# Patient Record
Sex: Male | Born: 1940 | Race: White | Hispanic: No | Marital: Single | Smoking: Current some day smoker
Health system: Southern US, Community
[De-identification: ages and names within clinical notes are randomized; demographics above are authoritative.]

## PROBLEM LIST (undated history)

## (undated) DIAGNOSIS — Z789 Other specified health status: Secondary | ICD-10-CM

## (undated) HISTORY — PX: COLONOSCOPY: SHX174

---

## 2000-08-10 ENCOUNTER — Encounter: Payer: Self-pay | Admitting: Emergency Medicine

## 2000-08-10 ENCOUNTER — Emergency Department (HOSPITAL_COMMUNITY): Admission: EM | Admit: 2000-08-10 | Discharge: 2000-08-10 | Payer: Self-pay | Admitting: Emergency Medicine

## 2015-06-11 ENCOUNTER — Emergency Department (HOSPITAL_COMMUNITY)
Admission: EM | Admit: 2015-06-11 | Discharge: 2015-06-11 | Disposition: A | Discharge status: Home / Self Care | Payer: Non-veteran care | Attending: Emergency Medicine | Admitting: Emergency Medicine

## 2015-06-11 ENCOUNTER — Emergency Department (HOSPITAL_COMMUNITY): Payer: Non-veteran care

## 2015-06-11 ENCOUNTER — Encounter (HOSPITAL_COMMUNITY): Payer: Self-pay | Admitting: *Deleted

## 2015-06-11 DIAGNOSIS — Y998 Other external cause status: Secondary | ICD-10-CM | POA: Insufficient documentation

## 2015-06-11 DIAGNOSIS — Y9389 Activity, other specified: Secondary | ICD-10-CM | POA: Diagnosis not present

## 2015-06-11 DIAGNOSIS — Z791 Long term (current) use of non-steroidal anti-inflammatories (NSAID): Secondary | ICD-10-CM | POA: Insufficient documentation

## 2015-06-11 DIAGNOSIS — W01198A Fall on same level from slipping, tripping and stumbling with subsequent striking against other object, initial encounter: Secondary | ICD-10-CM | POA: Insufficient documentation

## 2015-06-11 DIAGNOSIS — Z720 Tobacco use: Secondary | ICD-10-CM | POA: Insufficient documentation

## 2015-06-11 DIAGNOSIS — S0990XA Unspecified injury of head, initial encounter: Secondary | ICD-10-CM

## 2015-06-11 DIAGNOSIS — Y9289 Other specified places as the place of occurrence of the external cause: Secondary | ICD-10-CM | POA: Insufficient documentation

## 2015-06-11 DIAGNOSIS — S0081XA Abrasion of other part of head, initial encounter: Secondary | ICD-10-CM | POA: Diagnosis not present

## 2015-06-11 NOTE — ED Notes (Signed)
Pt reports he was moving grass, tripped, fell and hit head on concrete. Pt possibly had LOC, was disoriented after fall. Pt have difficulty walking. Family reports pt was confused initially, family reports pt is at baseline norm. No neuro deficits noted at this time. Denies pain. Pt has bruising and lacerations to right eye area.

## 2015-06-11 NOTE — ED Provider Notes (Signed)
CSN: [id number]     Arrival date & time 06/11/15  1731 History   First MD Initiated Contact with Patient 06/11/15 2016     Chief Complaint  Patient presents with  . Fall  . Head Injury      HPI Patient reports he was mowing the grass this afternoon when he tripped over some trash cans.  He fell forward and injured his right forehead and right peroneal region.  He denies changes in his vision.  His wife reports that initially he came into the house and seemed to lose his balance again and this concerned her.  She brought him to the emergency department for evaluation of closed head injury.  No use of anticoagulants.  He does admit to drinking whiskey today.  He states he feels much better this time.      History reviewed. No pertinent past medical history. History reviewed. No pertinent past surgical history. History reviewed. No pertinent family history. History  Substance Use Topics  . Smoking status: Current Some Day Smoker  . Smokeless tobacco: Not on file  . Alcohol Use: Yes    Review of Systems  All other systems reviewed and are negative.     Allergies  Review of patient's allergies indicates no known allergies.  Home Medications   Prior to Admission medications   Medication Sig Start Date End Date Taking? Authorizing Provider  naproxen (NAPROSYN) 500 MG tablet Take 500 mg by mouth daily at 12 noon.   Yes Historical Provider, MD   BP 118/75 mmHg  Pulse 77  Temp(Src) 98.2 F (36.8 C) (Oral)  Resp 20  SpO2 96% Physical Exam  Constitutional: He is oriented to person, place, and time. He appears well-developed and well-nourished.  HENT:  Head: Normocephalic and atraumatic.  Small abrasions to his right paravertebral rim.  I movements are intact.  No lacerations.  No active bleeding.  Eyes: EOM are normal.  Neck: Normal range of motion.  Cardiovascular: Normal rate, regular rhythm, normal heart sounds and intact distal pulses.   Pulmonary/Chest: Effort normal  and breath sounds normal. No respiratory distress.  Abdominal: Soft. He exhibits no distension. There is no tenderness.  Musculoskeletal: Normal range of motion.  Neurological: He is alert and oriented to person, place, and time.  Skin: Skin is warm and dry.  Psychiatric: He has a normal mood and affect. Judgment normal.  Nursing note and vitals reviewed.   ED Course  Procedures (including critical care time) Labs Review Labs Reviewed - No data to display  Imaging Review Ct Head Wo Contrast  06/11/2015   CLINICAL DATA:  Dizziness with fall  EXAM: CT HEAD WITHOUT CONTRAST  TECHNIQUE: Contiguous axial images were obtained from the base of the skull through the vertex without intravenous contrast.  COMPARISON:  None.  FINDINGS: There is age related volume loss. There is no intracranial mass, hemorrhage, extra-axial fluid collection, or midline shift. The gray-white compartments appear normal. No acute infarct apparent. The bony calvarium appears intact. The mastoid air cells are clear. There is soft tissue swelling over the right upper face.  IMPRESSION: Soft tissue swelling over right upper face. There is age related volume loss. No intracranial mass, hemorrhage, or extra-axial fluid collection. Gray-white compartments appear within normal limits.   Electronically Signed   By: Bretta Bang III M.D.   On: 06/11/2015 19:50  I personally reviewed the imaging tests through PACS system I reviewed available ER/hospitalization records through the EMR    EKG Interpretation  Date/Time:  Thursday June 11 2015 17:59:27 EDT Ventricular Rate:  76 PR Interval:  168 QRS Duration: 111 QT Interval:  393 QTC Calculation: 442 R Axis:   69 Text Interpretation:  Sinus rhythm Baseline wander in lead(s) V2 No old  tracing to compare Confirmed by Paetyn Pietrzak  MD, Caryn BeeKEVIN ([id number]) on 06/11/2015  9:05:03 PM      MDM   Final diagnoses:  None   Patient is overall well-appearing.  Discharge home in good  condition.  CT imaging negative.  C-spine nontender.    Azalia BilisKevin Pauline Pegues, MD 06/11/15 2122

## 2015-06-11 NOTE — Discharge Instructions (Signed)

## 2018-11-28 HISTORY — PX: FOOT SURGERY: SHX648

## 2020-01-05 ENCOUNTER — Emergency Department (HOSPITAL_COMMUNITY): Payer: No Typology Code available for payment source

## 2020-01-05 ENCOUNTER — Emergency Department (HOSPITAL_COMMUNITY)
Admission: EM | Admit: 2020-01-05 | Discharge: 2020-01-05 | Disposition: A | Discharge status: Home / Self Care | Payer: No Typology Code available for payment source | Attending: Emergency Medicine | Admitting: Emergency Medicine

## 2020-01-05 ENCOUNTER — Other Ambulatory Visit: Payer: Self-pay

## 2020-01-05 ENCOUNTER — Encounter (HOSPITAL_COMMUNITY): Payer: Self-pay | Admitting: Emergency Medicine

## 2020-01-05 DIAGNOSIS — F17290 Nicotine dependence, other tobacco product, uncomplicated: Secondary | ICD-10-CM | POA: Diagnosis not present

## 2020-01-05 DIAGNOSIS — S99911A Unspecified injury of right ankle, initial encounter: Secondary | ICD-10-CM | POA: Diagnosis present

## 2020-01-05 DIAGNOSIS — W000XXA Fall on same level due to ice and snow, initial encounter: Secondary | ICD-10-CM | POA: Diagnosis not present

## 2020-01-05 DIAGNOSIS — X500XXA Overexertion from strenuous movement or load, initial encounter: Secondary | ICD-10-CM | POA: Diagnosis not present

## 2020-01-05 DIAGNOSIS — Y999 Unspecified external cause status: Secondary | ICD-10-CM | POA: Diagnosis not present

## 2020-01-05 DIAGNOSIS — S82891A Other fracture of right lower leg, initial encounter for closed fracture: Secondary | ICD-10-CM | POA: Insufficient documentation

## 2020-01-05 DIAGNOSIS — Z79899 Other long term (current) drug therapy: Secondary | ICD-10-CM | POA: Insufficient documentation

## 2020-01-05 DIAGNOSIS — Y9389 Activity, other specified: Secondary | ICD-10-CM | POA: Diagnosis not present

## 2020-01-05 DIAGNOSIS — Y92007 Garden or yard of unspecified non-institutional (private) residence as the place of occurrence of the external cause: Secondary | ICD-10-CM | POA: Diagnosis not present

## 2020-01-05 MED ORDER — HYDROCODONE-ACETAMINOPHEN 5-325 MG PO TABS: 1.0000 | ORAL_TABLET | Freq: Two times a day (BID) | ORAL | 0 refills | Status: DC | PRN

## 2020-01-05 NOTE — ED Notes (Signed)
Assist pt with bedpan 

## 2020-01-05 NOTE — ED Provider Notes (Signed)
[facility]-EMERGENCY DEPT Provider Note   CSN: [id number] Arrival date & time: 01/05/20  6629     History Chief Complaint  Patient presents with   Ankle Pain   Fall    Calem Cocozza is a 79 y.o. male.  HPI   79 year old male presenting for evaluation after mechanical fall.  States that he was walking to his mailbox to get the newspaper when he slipped in the snow and twisted his ankle.  Had sudden onset of pain.  States that initially his ankle was angled outwards but it "popped back into place ".  He has 5/10 pain to the right ankle at this time.  He denies any head trauma or LOC.  Denies any neck or back pain or any other injuries at this time.  He is not on any blood thinning medication.  History reviewed. No pertinent past medical history.  There are no problems to display for this patient.  History reviewed. No pertinent surgical history.    No family history on file.  Social History   Tobacco Use   Smoking status: Current Some Day Smoker    Types: Cigars   Smokeless tobacco: Never Used  Substance Use Topics   Alcohol use: Yes   Drug use: No    Home Medications Prior to Admission medications   Medication Sig Start Date End Date Taking? Authorizing Provider  diphenhydrAMINE (BENADRYL) 50 MG tablet Take 50 mg by mouth at bedtime as needed for sleep.   Yes [provider]  etodolac (LODINE) 300 MG capsule Take 300 mg by mouth 2 (two) times daily after a meal.   Yes [provider]  HYDROcodone-acetaminophen (NORCO/VICODIN) 5-325 MG tablet Take 1 tablet by mouth every 12 (twelve) hours as needed. 01/05/20   Ariely Riddell S, PA-C    Allergies    Patient has no known allergies.  Review of Systems   Review of Systems  Constitutional: Negative for fever.  HENT: Negative for ear pain and sore throat.   Eyes: Negative for visual disturbance.  Respiratory: Negative for shortness of breath.   Cardiovascular: Negative  for chest pain.  Gastrointestinal: Negative for abdominal pain.  Genitourinary: Negative for flank pain.  Musculoskeletal: Negative for back pain and neck pain.       Right ankle pain  Skin: Negative for rash.  Neurological:       No head trauma or loc  All other systems reviewed and are negative.   Physical Exam Updated Vital Signs BP (!) 162/80 (BP Location: Left Arm)    Pulse 86    Resp 18    Ht 5\' 11"  (1.803 m)    Wt 95.3 kg    SpO2 99%    BMI 29.29 kg/m   Physical Exam Vitals and nursing note reviewed.  Constitutional:      Appearance: He is well-developed.  HENT:     Head: Normocephalic and atraumatic.  Eyes:     Conjunctiva/sclera: Conjunctivae normal.  Cardiovascular:     Rate and Rhythm: Normal rate and regular rhythm.     Heart sounds: Normal heart sounds.  Pulmonary:     Effort: Pulmonary effort is normal. No respiratory distress.     Breath sounds: Normal breath sounds. No wheezing, rhonchi or rales.  Abdominal:     General: Bowel sounds are normal. There is no distension.     Palpations: Abdomen is soft.     Tenderness: There is no abdominal tenderness. There is no guarding  or rebound.  Musculoskeletal:     Cervical back: Neck supple.     Comments: Right ankle TTP to the bilat malleoli with diffuse swelling and obvious deformity. No obvious TTP throughout the foot, tib/fib, or knee. Normal distal pulses and sensation. Brisk cap refill. No midline TTP to the cervical, thoracic or lumbar spine.   Skin:    General: Skin is warm and dry.  Neurological:     Mental Status: He is alert.     Comments: Mental Status:  Alert, thought content appropriate, able to give a coherent history. Speech fluent without evidence of aphasia. Able to follow 2 step commands without difficulty.  Cranial Nerves:  II: pupils equal, round, reactive to light III,IV, VI: ptosis not present, extra-ocular motions intact bilaterally  V,VII: smile symmetric, facial light touch sensation  equal VIII: hearing grossly normal to voice  X: uvula elevates symmetrically  XI: bilateral shoulder shrug symmetric and strong XII: midline tongue extension without fassiculations Motor:  Normal tone. 5/5 strength of BUE and BLE major muscle groups including strong and equal grip strength  Sensory: light touch normal in all extremities.     ED Results / Procedures / Treatments   Labs (all labs ordered are listed, but only abnormal results are displayed) Labs Reviewed - No data to display  EKG None  Radiology DG Ankle 2 Views Right  Result Date: 01/05/2020 CLINICAL DATA:  Status post reduction of right ankle dislocation. EXAM: RIGHT ANKLE - 2 VIEW COMPARISON:  Same day. FINDINGS: Status post casting and immobilization of the right ankle. Moderately displaced distal right fibular fracture is noted. There remains mild lateral dislocation of the talus relative to distal tibia. IMPRESSION: Status post casting and immobilization of the right ankle. Moderately displaced distal right fibular fracture is noted. Mild lateral dislocation of the talus relative to distal tibia is noted. Electronically Signed   By: Marijo Conception M.D.   On: 01/05/2020 13:27   DG Ankle Complete Right  Result Date: 01/05/2020 CLINICAL DATA:  Acute RIGHT ankle pain and swelling following fall. Initial encounter. EXAM: RIGHT ANKLE - COMPLETE 3+ VIEW COMPARISON:  None. FINDINGS: An oblique fracture of the distal fibula is noted with 5 mm LATERAL displacement. A probable nondisplaced transverse fracture of the MEDIAL malleolus is noted. 4 mm LATERAL subluxation at the tibiotalar joint is noted. Associated soft tissue swelling is noted. IMPRESSION: 1. Oblique fracture of the distal fibula with 5 mm LATERAL displacement and probable nondisplaced transverse fracture of the MEDIAL malleolus. 2. 4 mm LATERAL subluxation at the tibiotalar joint. Electronically Signed   By: Margarette Canada M.D.   On: 01/05/2020 10:08   DG Foot Complete  Right  Result Date: 01/05/2020 CLINICAL DATA:  Acute RIGHT ankle pain and swelling following fall. Initial encounter. EXAM: RIGHT FOOT COMPLETE - 3+ VIEW COMPARISON:  None. FINDINGS: An oblique fracture of the distal fibula and a transverse fracture of the medial malleolus is visualized on the edge of the film. No other acute fracture, subluxation or dislocation identified. Degenerative changes in the midfoot and 1st MTP joint noted. The Lisfranc joints appear intact. IMPRESSION: 1. Distal fibular and tibial fractures which will be further discussed on the ankle radiographs. 2. Degenerative changes in the midfoot and 1st MTP joint. Electronically Signed   By: Margarette Canada M.D.   On: 01/05/2020 10:06    Procedures Procedures (including critical care time)  SPLINT APPLICATION Date/Time: 2:99 PM Authorized by: Rodney Booze Consent: Verbal consent obtained. Risks  and benefits: risks, benefits and alternatives were discussed Consent given by: patient Splint applied by: Dr. Rodena Medin and myself Location details: RLE Splint type: posterior ankle splint with stirrup Supplies used: posterior ankle splint with stirrup Post-procedure: The splinted body part was neurovascularly unchanged following the procedure. Patient tolerance: Patient tolerated the procedure well with no immediate complications.   Medications Ordered in ED Medications - No data to display  ED Course  I have reviewed the triage vital signs and the nursing notes.  Pertinent labs & imaging results that were available during my care of the patient were reviewed by me and considered in my medical decision making (see chart for details).    MDM Rules/Calculators/A&P                      79 year old man presenting after mechanical fall with right ankle injury.  No head trauma or LOC.  No tenderness to the midline cervical thoracic or lumbar spine.  Neurologic exam is normal.  He does have swelling and obvious deformity to the  right ankle.  He is neurovascularly intact distally.  Patient declines pain medications after initial evaluation.  X-ray right ankle with oblique fracture of the distal fibula with 5 mm LATERAL displacement and probable nondisplaced transverse fracture of the MEDIAL malleolus. Also with 4 mm LATERAL subluxation at the tibiotalar joint.  X-ray right foot with sistal fibular and tibial fractures which will be further discussed on the ankle radiographs. Degenerative changes in the midfoot and 1st MTP joint.  10:42 PM CONSULT with Dr. Magnus Ivan who recommends slight reduction while applying splint. Recommended slightly inverting the ankle and applying some extra padding. He will need to f/u in the office and will likely need definitive surgical repair.   Posterior ankle splint with stirrup placed in the ED. Reduction was performed by Dr. Rodena Medin. Post reduction films reviewed. Pt given crutches. He feels he will be able to manage with crutches and prefers this rather than a walker.  Case discussed with Dr. Rodena Medin who personally evaluated the pt and is in agreement with plan.   Discussed plan for f/u with ortho. He would like to f/u with the [place]. Advised he can do so but if unable to get appt in the next week he needs to f/u with Dr. Magnus Ivan. Advised on return precautions. He voiced understanding and is in agreement with plan. All questions answered, pt stable for d/c.    Final Clinical Impression(s) / ED Diagnoses Final diagnoses:  Closed fracture of right ankle, initial encounter    Rx / DC Orders ED Discharge Orders         Ordered    HYDROcodone-acetaminophen (NORCO/VICODIN) 5-325 MG tablet  Every 12 hours PRN     01/05/20 1354           Karrie Meres, PA-C 01/05/20 1357    Wynetta Fines, MD 01/05/20 [phone]

## 2020-01-05 NOTE — ED Provider Notes (Signed)
Reduction of Fracture (Right Ankle) Date/Time: 1:17 PM Performed by: Wynetta Fines Authorized by: Wynetta Fines Consent: Verbal consent obtained. Risks and benefits: risks, benefits and alternatives were discussed Consent given by: patient Required items: required blood products, implants, devices, and special equipment available Time out: Immediately prior to procedure a "time out" was called to verify the correct patient, procedure, equipment, support staff and site/side marked as required.  Patient tolerance: Patient tolerated the procedure well with no immediate complications. Joint: Right Ankle  Reduction technique: Manipulation during splint application (ankle inversion and medial pressure to lateral malleolus)    Wynetta Fines, MD 01/05/20 1318

## 2020-01-05 NOTE — Discharge Instructions (Addendum)
You were given information to follow-up with Dr. Magnus Ivan and orthopedic doctor to see you about your ankle fracture.  If you would prefer to follow-up at the [facility] you can do so but if you are unable to get an appointment in the next week then you should follow-up with Dr. Magnus Ivan as you will need to have surgery on the ankle to definitively repair it.  Prescription given for Norco. Take medication as directed and do not operate machinery, drive a car, or work while taking this medication as it can make you drowsy.   Do not put any weight on the right lower extremity.  Please return to the emergency department for any new or worsening symptoms.

## 2020-01-05 NOTE — ED Provider Notes (Signed)
SPLINT APPLICATION Date/Time: 1:13 PM Authorized by: Wynetta Fines Consent: Verbal consent obtained. Risks and benefits: risks, benefits and alternatives were discussed Consent given by: patient Splint applied by: Myself Location details: Right Ankle  Splint type: Right ankle posterior with stirrup Supplies used: orthoglass, ace wrap Post-procedure: The splinted body part was neurovascularly unchanged following the procedure. Patient tolerance: Patient tolerated the procedure well with no immediate complications.      Wynetta Fines, MD 01/05/20 1314

## 2020-01-05 NOTE — ED Notes (Signed)
ED Provider at bedside. 

## 2020-01-05 NOTE — ED Triage Notes (Signed)
Per [facility] pt was getting his paper form box when slipped on snow/ice and went down a little embankment. Pt has deformity to right ankle. Pt not on any blood thinners, no LOC.  Vitals: 170/100, 159/110, 85HR, 98% on RA.

## 2020-01-08 ENCOUNTER — Encounter: Payer: Self-pay | Admitting: Physician Assistant

## 2020-01-08 ENCOUNTER — Other Ambulatory Visit: Payer: Self-pay

## 2020-01-08 ENCOUNTER — Ambulatory Visit (INDEPENDENT_AMBULATORY_CARE_PROVIDER_SITE_OTHER): Payer: No Typology Code available for payment source | Admitting: Physician Assistant

## 2020-01-08 DIAGNOSIS — S82841A Displaced bimalleolar fracture of right lower leg, initial encounter for closed fracture: Secondary | ICD-10-CM

## 2020-01-08 NOTE — Progress Notes (Signed)
   Office Visit Note   Patient: Alan Edwards           Date of Birth: 1941-05-21           MRN: [id number] Visit Date: 01/08/2020              Requested by: [facility] [address] [facility] [place],  [place] [address] PCP: [facility]   Assessment & Plan: Visit Diagnoses:  1. Ankle fracture, bimalleolar, closed, right, initial encounter     Plan: Recommend open reduction internal fixation of the right ankle.  Risk benefits surgery discussed with the patient.  He will remain nonweightbearing on the right ankle and keep splint clean dry intact.  We will proceed with surgery early next week.  Questions were encouraged and answered at length.  Follow-Up Instructions: Return 2 weeks postop.   Orders:  No orders of the defined types were placed in this encounter.  No orders of the defined types were placed in this encounter.     Procedures: No procedures performed   Clinical Data: No additional findings.   Subjective: Chief Complaint  Patient presents with  . Right Ankle - Fracture    HPI Mr. Raczkowski is a 79 year old male who was seen for the first time.  He reports that on 01/05/2020 he was going out to get the paper and slipped in the snow injuring his right ankle.  He was seen in [place], ER where he was found to have right ankle fracture.  He is placed in a splint given crutches told to follow-up with orthopedics.  No other injury no loss of consciousness at the time of the right ankle injury.  He denies any fevers chills shortness of breath chest pain.  Denies any cardiac conditions, diabetes or other medical conditions. Radiographs right ankle 3 views are reviewed and shows an oblique fracture of the distal fibula with lateralization.  Nondisplaced transverse fracture of the medial malleolus.  Also lateral subluxation at the tibiotalar joint.  No other fractures. Review of Systems See HPI otherwise negative  Objective: Vital Signs:  There were no vitals taken for this visit.  Physical Exam Constitutional:      Appearance: He is normal weight. He is not ill-appearing or diaphoretic.  Pulmonary:     Effort: Pulmonary effort is normal.  Neurological:     Mental Status: He is alert and oriented to person, place, and time.  Psychiatric:        Mood and Affect: Mood normal.     Ortho Exam Right ankle splint is clean and dry and tight.  Sensation grossly intact throughout the toes.  Toes are well-perfused.  Right calf supple nontender. Specialty Comments:  No specialty comments available.  Imaging: No results found.   PMFS History: There are no problems to display for this patient.  History reviewed. No pertinent past medical history.  History reviewed. No pertinent family history.  History reviewed. No pertinent surgical history. Social History   Occupational History  . Not on file  Tobacco Use  . Smoking status: Current Some Day Smoker    Types: Cigars  . Smokeless tobacco: Never Used  Substance and Sexual Activity  . Alcohol use: Yes  . Drug use: No  . Sexual activity: Not on file

## 2020-01-09 ENCOUNTER — Other Ambulatory Visit: Payer: Self-pay | Admitting: Physician Assistant

## 2020-01-10 ENCOUNTER — Encounter (HOSPITAL_COMMUNITY): Payer: Self-pay | Admitting: Orthopaedic Surgery

## 2020-01-10 ENCOUNTER — Other Ambulatory Visit (HOSPITAL_COMMUNITY)
Admission: RE | Admit: 2020-01-10 | Discharge: 2020-01-10 | Disposition: A | Discharge status: Home / Self Care | Payer: No Typology Code available for payment source | Source: Ambulatory Visit | Attending: Orthopaedic Surgery | Admitting: Orthopaedic Surgery

## 2020-01-10 ENCOUNTER — Other Ambulatory Visit: Payer: Self-pay

## 2020-01-10 DIAGNOSIS — Z01812 Encounter for preprocedural laboratory examination: Secondary | ICD-10-CM | POA: Insufficient documentation

## 2020-01-10 DIAGNOSIS — Z20822 Contact with and (suspected) exposure to covid-19: Secondary | ICD-10-CM | POA: Diagnosis not present

## 2020-01-10 LAB — SARS CORONAVIRUS 2 (TAT 6-24 HRS): SARS Coronavirus 2: NEGATIVE

## 2020-01-10 NOTE — Progress Notes (Signed)
Spoke with pt for pre-op call. Pt denies cardiac history, diabetes or HTN. Pt asked that I talk with his friend [name] and give her the pre-op instructions which I did.   Pt will have his Covid test done today. Pt and [name] given quarantine instructions and voiced understanding. Pt had his 1st Covid vaccine 2 weeks ago. 2nd is scheduled for next week.   Pt goes to the [facility] for healthcare.  Pt instructed not to smoke 24 hours prior to surgery.  [name] states she will come by this afternoon after pt gets his Covid test done and they will pick up the Pre-Surgery Ensure drink - instructions given to [facility].

## 2020-01-14 ENCOUNTER — Encounter (HOSPITAL_COMMUNITY): Payer: Self-pay | Admitting: Orthopaedic Surgery

## 2020-01-14 ENCOUNTER — Ambulatory Visit (HOSPITAL_COMMUNITY)
Admission: RE | Admit: 2020-01-14 | Discharge: 2020-01-14 | Disposition: A | Discharge status: Home / Self Care | Payer: No Typology Code available for payment source | Attending: Orthopaedic Surgery | Admitting: Orthopaedic Surgery

## 2020-01-14 ENCOUNTER — Encounter (HOSPITAL_COMMUNITY)
Admission: RE | Disposition: A | Discharge status: Home / Self Care | Payer: Self-pay | Source: Home / Self Care | Attending: Orthopaedic Surgery

## 2020-01-14 ENCOUNTER — Other Ambulatory Visit: Payer: Self-pay

## 2020-01-14 ENCOUNTER — Ambulatory Visit (HOSPITAL_COMMUNITY): Payer: No Typology Code available for payment source | Admitting: Anesthesiology

## 2020-01-14 ENCOUNTER — Ambulatory Visit (HOSPITAL_COMMUNITY): Payer: No Typology Code available for payment source

## 2020-01-14 DIAGNOSIS — W010XXA Fall on same level from slipping, tripping and stumbling without subsequent striking against object, initial encounter: Secondary | ICD-10-CM | POA: Diagnosis not present

## 2020-01-14 DIAGNOSIS — F17290 Nicotine dependence, other tobacco product, uncomplicated: Secondary | ICD-10-CM | POA: Insufficient documentation

## 2020-01-14 DIAGNOSIS — S93431A Sprain of tibiofibular ligament of right ankle, initial encounter: Secondary | ICD-10-CM | POA: Insufficient documentation

## 2020-01-14 DIAGNOSIS — S8261XA Displaced fracture of lateral malleolus of right fibula, initial encounter for closed fracture: Secondary | ICD-10-CM | POA: Insufficient documentation

## 2020-01-14 DIAGNOSIS — Z79899 Other long term (current) drug therapy: Secondary | ICD-10-CM | POA: Diagnosis not present

## 2020-01-14 DIAGNOSIS — Z419 Encounter for procedure for purposes other than remedying health state, unspecified: Secondary | ICD-10-CM

## 2020-01-14 HISTORY — DX: Other specified health status: Z78.9

## 2020-01-14 HISTORY — PX: ORIF ANKLE FRACTURE: SHX5408

## 2020-01-14 LAB — COMPREHENSIVE METABOLIC PANEL
ALT: 18 U/L (ref 0–44)
AST: 21 U/L (ref 15–41)
Albumin: 4.2 g/dL (ref 3.5–5.0)
Alkaline Phosphatase: 63 U/L (ref 38–126)
Anion gap: 11 (ref 5–15)
BUN: 12 mg/dL (ref 8–23)
CO2: 23 mmol/L (ref 22–32)
Calcium: 9.6 mg/dL (ref 8.9–10.3)
Chloride: 102 mmol/L (ref 98–111)
Creatinine, Ser: 1.23 mg/dL (ref 0.61–1.24)
GFR calc Af Amer: >60 mL/min (ref >60)
GFR calc non Af Amer: 56 mL/min — ABNORMAL LOW (ref >60)
Glucose, Bld: 204 mg/dL — ABNORMAL HIGH (ref 70–99)
Potassium: 4 mmol/L (ref 3.5–5.1)
Sodium: 136 mmol/L (ref 135–145)
Total Bilirubin: 1.1 mg/dL (ref 0.3–1.2)
Total Protein: 7.4 g/dL (ref 6.5–8.1)

## 2020-01-14 LAB — HEMOGLOBIN: Hemoglobin: 15.5 g/dL (ref 13.0–17.0)

## 2020-01-14 SURGERY — OPEN REDUCTION INTERNAL FIXATION (ORIF) ANKLE FRACTURE
Anesthesia: Monitor Anesthesia Care | Site: Ankle | Laterality: Right

## 2020-01-14 MED ORDER — PHENYLEPHRINE 40 MCG/ML (10ML) SYRINGE FOR IV PUSH (FOR BLOOD PRESSURE SUPPORT)
PREFILLED_SYRINGE | INTRAVENOUS | Status: DC | PRN
  Administered 2020-01-14 (×5): 80 ug via INTRAVENOUS

## 2020-01-14 MED ORDER — MIDAZOLAM HCL 2 MG/2ML IJ SOLN
INTRAMUSCULAR | Status: AC
  Administered 2020-01-14: 2 mg via INTRAVENOUS
  Filled 2020-01-14: qty 2

## 2020-01-14 MED ORDER — ONDANSETRON HCL 4 MG/2ML IJ SOLN
INTRAMUSCULAR | Status: AC
  Filled 2020-01-14: qty 2

## 2020-01-14 MED ORDER — DEXAMETHASONE SODIUM PHOSPHATE 4 MG/ML IJ SOLN
INTRAMUSCULAR | Status: DC | PRN
  Administered 2020-01-14: 4 mg via INTRAVENOUS

## 2020-01-14 MED ORDER — HYDROMORPHONE HCL 1 MG/ML IJ SOLN: 0.2500 mg | INTRAMUSCULAR | Status: DC | PRN

## 2020-01-14 MED ORDER — ONDANSETRON HCL 4 MG/2ML IJ SOLN: 4.0000 mg | Freq: Once | INTRAMUSCULAR | Status: DC | PRN

## 2020-01-14 MED ORDER — FENTANYL CITRATE (PF) 250 MCG/5ML IJ SOLN
INTRAMUSCULAR | Status: AC
  Filled 2020-01-14: qty 5

## 2020-01-14 MED ORDER — LACTATED RINGERS IV SOLN: INTRAVENOUS | Status: DC | PRN

## 2020-01-14 MED ORDER — PROPOFOL 10 MG/ML IV BOLUS
INTRAVENOUS | Status: AC
  Filled 2020-01-14: qty 20

## 2020-01-14 MED ORDER — PROPOFOL 10 MG/ML IV BOLUS
INTRAVENOUS | Status: DC | PRN
  Administered 2020-01-14: 20 mg via INTRAVENOUS
  Administered 2020-01-14: 30 mg via INTRAVENOUS

## 2020-01-14 MED ORDER — FENTANYL CITRATE (PF) 100 MCG/2ML IJ SOLN
INTRAMUSCULAR | Status: AC
  Administered 2020-01-14: 100 ug via INTRAVENOUS
  Filled 2020-01-14: qty 2

## 2020-01-14 MED ORDER — OXYCODONE HCL 5 MG/5ML PO SOLN: 5.0000 mg | Freq: Once | ORAL | Status: DC | PRN

## 2020-01-14 MED ORDER — CHLORHEXIDINE GLUCONATE 4 % EX LIQD: 60.0000 mL | Freq: Once | CUTANEOUS | Status: DC

## 2020-01-14 MED ORDER — ACETAMINOPHEN 500 MG PO TABS
1000.0000 mg | ORAL_TABLET | Freq: Once | ORAL | Status: AC
  Administered 2020-01-14: 1000 mg via ORAL
  Filled 2020-01-14: qty 2

## 2020-01-14 MED ORDER — MIDAZOLAM HCL 2 MG/2ML IJ SOLN: 2.0000 mg | Freq: Once | INTRAMUSCULAR | Status: AC

## 2020-01-14 MED ORDER — FENTANYL CITRATE (PF) 100 MCG/2ML IJ SOLN: 100.0000 ug | Freq: Once | INTRAMUSCULAR | Status: AC

## 2020-01-14 MED ORDER — PROPOFOL 500 MG/50ML IV EMUL
INTRAVENOUS | Status: DC | PRN
  Administered 2020-01-14: 100 ug/kg/min via INTRAVENOUS

## 2020-01-14 MED ORDER — PHENYLEPHRINE 40 MCG/ML (10ML) SYRINGE FOR IV PUSH (FOR BLOOD PRESSURE SUPPORT)
PREFILLED_SYRINGE | INTRAVENOUS | Status: AC
  Filled 2020-01-14: qty 10

## 2020-01-14 MED ORDER — ONDANSETRON HCL 4 MG/2ML IJ SOLN
INTRAMUSCULAR | Status: DC | PRN
  Administered 2020-01-14: 4 mg via INTRAVENOUS

## 2020-01-14 MED ORDER — LIDOCAINE 2% (20 MG/ML) 5 ML SYRINGE
INTRAMUSCULAR | Status: AC
  Filled 2020-01-14: qty 5

## 2020-01-14 MED ORDER — ROPIVACAINE HCL 5 MG/ML IJ SOLN
INTRAMUSCULAR | Status: DC | PRN
  Administered 2020-01-14: 40 mL via EPIDURAL

## 2020-01-14 MED ORDER — OXYCODONE HCL 5 MG PO TABS: 5.0000 mg | ORAL_TABLET | Freq: Four times a day (QID) | ORAL | 0 refills | Status: DC | PRN

## 2020-01-14 MED ORDER — 0.9 % SODIUM CHLORIDE (POUR BTL) OPTIME
TOPICAL | Status: DC | PRN
  Administered 2020-01-14: 15:00:00 1000 mL

## 2020-01-14 MED ORDER — OXYCODONE HCL 5 MG PO TABS: 5.0000 mg | ORAL_TABLET | Freq: Once | ORAL | Status: DC | PRN

## 2020-01-14 MED ORDER — DEXAMETHASONE SODIUM PHOSPHATE 10 MG/ML IJ SOLN
INTRAMUSCULAR | Status: DC | PRN
  Administered 2020-01-14: 10 mg

## 2020-01-14 MED ORDER — MIDAZOLAM HCL 2 MG/2ML IJ SOLN
INTRAMUSCULAR | Status: AC
  Filled 2020-01-14: qty 2

## 2020-01-14 MED ORDER — DEXAMETHASONE SODIUM PHOSPHATE 10 MG/ML IJ SOLN
INTRAMUSCULAR | Status: AC
  Filled 2020-01-14: qty 1

## 2020-01-14 MED ORDER — CEFAZOLIN SODIUM-DEXTROSE 2-4 GM/100ML-% IV SOLN
2.0000 g | INTRAVENOUS | Status: AC
  Administered 2020-01-14: 15:00:00 2 g via INTRAVENOUS
  Filled 2020-01-14: qty 100

## 2020-01-14 SURGICAL SUPPLY — 67 items
BANDAGE ESMARK 6X9 LF (GAUZE/BANDAGES/DRESSINGS) IMPLANT
BIT DRILL 122X3.5XAO NS (BIT) ×1 IMPLANT
BIT DRILL 2.6 (BIT) ×1
BIT DRILL 2.6MM (BIT) ×1
BIT DRILL 2.6X VARIAX 2 (BIT) ×1 IMPLANT
BIT DRILL 3.5 (BIT) ×2
BIT DRL 122X3.5XAO NS (BIT) ×1
BIT DRL 2.6X VARIAX 2 (BIT) ×1
BNDG ELASTIC 4X5.8 VLCR STR LF (GAUZE/BANDAGES/DRESSINGS) ×3 IMPLANT
BNDG ELASTIC 6X5.8 VLCR STR LF (GAUZE/BANDAGES/DRESSINGS) ×3 IMPLANT
BNDG ESMARK 4X9 LF (GAUZE/BANDAGES/DRESSINGS) ×3 IMPLANT
BNDG ESMARK 6X9 LF (GAUZE/BANDAGES/DRESSINGS)
COVER SURGICAL LIGHT HANDLE (MISCELLANEOUS) ×3 IMPLANT
COVER WAND RF STERILE (DRAPES) ×3 IMPLANT
CUFF TOURN SGL QUICK 34 (TOURNIQUET CUFF)
CUFF TOURN SGL QUICK 42 (TOURNIQUET CUFF) IMPLANT
CUFF TRNQT CYL 34X4.125X (TOURNIQUET CUFF) IMPLANT
DRAPE C-ARM 42X72 X-RAY (DRAPES) ×3 IMPLANT
DRAPE C-ARMOR (DRAPES) ×3 IMPLANT
DRAPE U-SHAPE 47X51 STRL (DRAPES) ×3 IMPLANT
DURAPREP 26ML APPLICATOR (WOUND CARE) ×3 IMPLANT
ELECT REM PT RETURN 9FT ADLT (ELECTROSURGICAL) ×3
ELECTRODE REM PT RTRN 9FT ADLT (ELECTROSURGICAL) ×1 IMPLANT
GAUZE SPONGE 4X4 12PLY STRL (GAUZE/BANDAGES/DRESSINGS) ×3 IMPLANT
GAUZE XEROFORM 5X9 LF (GAUZE/BANDAGES/DRESSINGS) ×3 IMPLANT
GLOVE BIO SURGEON STRL SZ8 (GLOVE) ×3 IMPLANT
GLOVE BIOGEL PI IND STRL 8 (GLOVE) ×2 IMPLANT
GLOVE BIOGEL PI INDICATOR 8 (GLOVE) ×4
GLOVE ORTHO TXT STRL SZ7.5 (GLOVE) ×3 IMPLANT
GOWN STRL REUS W/ TWL LRG LVL3 (GOWN DISPOSABLE) ×2 IMPLANT
GOWN STRL REUS W/ TWL XL LVL3 (GOWN DISPOSABLE) ×4 IMPLANT
GOWN STRL REUS W/TWL LRG LVL3 (GOWN DISPOSABLE) ×4
GOWN STRL REUS W/TWL XL LVL3 (GOWN DISPOSABLE) ×8
KIT BASIN OR (CUSTOM PROCEDURE TRAY) ×3 IMPLANT
KIT TURNOVER KIT B (KITS) ×3 IMPLANT
MANIFOLD NEPTUNE II (INSTRUMENTS) ×6 IMPLANT
NEEDLE HYPO 25GX1X1/2 BEV (NEEDLE) IMPLANT
NS IRRIG 1000ML POUR BTL (IV SOLUTION) ×3 IMPLANT
PACK ORTHO EXTREMITY (CUSTOM PROCEDURE TRAY) ×3 IMPLANT
PAD ABD 8X10 STRL (GAUZE/BANDAGES/DRESSINGS) ×6 IMPLANT
PAD ARMBOARD 7.5X6 YLW CONV (MISCELLANEOUS) ×6 IMPLANT
PAD CAST 4YDX4 CTTN HI CHSV (CAST SUPPLIES) ×1 IMPLANT
PADDING CAST COTTON 4X4 STRL (CAST SUPPLIES) ×2
PADDING CAST COTTON 6X4 STRL (CAST SUPPLIES) ×3 IMPLANT
PLATE FIBULA 4H (Plate) ×3 IMPLANT
SCREW BN T10 FT 14X3.5XSTRDR (Screw) ×2 IMPLANT
SCREW BONE 3.5X14MM (Screw) ×4 IMPLANT
SCREW BONE 3.5X16MM (Screw) ×6 IMPLANT
SCREW BONE 3.5X20MM (Screw) ×3 IMPLANT
SCREW BONE FT 3.5X44 (Screw) ×3 IMPLANT
SCREW BONE FT 3.5X55 (Screw) ×3 IMPLANT
SCREW LOCK HEX T10 3.5X10 (Screw) ×3 IMPLANT
SCREW LOCK HEX T10 3.5X12 (Screw) ×3 IMPLANT
SPONGE LAP 4X18 RFD (DISPOSABLE) ×6 IMPLANT
SUCTION FRAZIER HANDLE 10FR (MISCELLANEOUS) ×2
SUCTION TUBE FRAZIER 10FR DISP (MISCELLANEOUS) ×1 IMPLANT
SUT ETHILON 2 0 FS 18 (SUTURE) ×3 IMPLANT
SUT VIC AB 0 CT1 27 (SUTURE) ×2
SUT VIC AB 0 CT1 27XBRD ANBCTR (SUTURE) ×1 IMPLANT
SUT VIC AB 2-0 CT1 27 (SUTURE) ×2
SUT VIC AB 2-0 CT1 TAPERPNT 27 (SUTURE) ×1 IMPLANT
SYR CONTROL 10ML LL (SYRINGE) IMPLANT
TOWEL GREEN STERILE (TOWEL DISPOSABLE) ×3 IMPLANT
TOWEL GREEN STERILE FF (TOWEL DISPOSABLE) ×3 IMPLANT
TUBE CONNECTING 12'X1/4 (SUCTIONS) ×1
TUBE CONNECTING 12X1/4 (SUCTIONS) ×2 IMPLANT
WATER STERILE IRR 1000ML POUR (IV SOLUTION) ×3 IMPLANT

## 2020-01-14 NOTE — Anesthesia Procedure Notes (Signed)
Anesthesia Regional Block: Popliteal block   Pre-Anesthetic Checklist: ,, timeout performed, Correct Patient, Correct Site, Correct Laterality, Correct Procedure, Correct Position, site marked, Risks and benefits discussed,  Surgical consent,  Pre-op evaluation,  At surgeon's request and post-op pain management  Laterality: Right  Prep: chloraprep       Needles:  Injection technique: Single-shot  Needle Type: Echogenic Stimulator Needle          Additional Needles:   Narrative:  Start time: 01/14/2020 1:28 PM End time: 01/14/2020 1:38 PM Injection made incrementally with aspirations every 5 mL.  Performed by: Personally  Anesthesiologist: Heather Roberts, MD  Additional Notes: A functioning IV was confirmed and monitors were applied.  Sterile prep and drape, hand hygiene and sterile gloves were used.  Negative aspiration and test dose prior to incremental administration of local anesthetic. The patient tolerated the procedure well.Ultrasound  guidance: relevant anatomy identified, needle position confirmed, local anesthetic spread visualized around nerve(s), vascular puncture avoided.  Image printed for medical record. ACB supplementation.

## 2020-01-14 NOTE — Brief Op Note (Signed)
01/14/2020  3:35 PM  PATIENT:  Alan Edwards  79 y.o. male  PRE-OPERATIVE DIAGNOSIS:  right lateral malleolus ankle fracture  POST-OPERATIVE DIAGNOSIS:  1) right lateral malleolus ankle fracture             2) syndesmosis disruption right ankle  PROCEDURE:  Procedure(s): OPEN REDUCTION INTERNAL FIXATION (ORIF) RIGHT ANKLE FRACTURE (Right)  SURGEON:  Surgeon(s) and Role:    Kathryne Hitch, MD - Primary  PHYSICIAN ASSISTANT: Rexene Edison, PA-C  ANESTHESIA:   regional and IV sedation  EBL:  25 mL   COUNTS:  YES  TOURNIQUET:  * Missing tourniquet times found for documented tourniquets in log: [id number] *  DICTATION: .Other Dictation: Dictation Number [id number]  PLAN OF CARE: Discharge to home after PACU  PATIENT DISPOSITION:  PACU - hemodynamically stable.   Delay start of Pharmacological VTE agent (>24hrs) due to surgical blood loss or risk of bleeding: no

## 2020-01-14 NOTE — Anesthesia Postprocedure Evaluation (Signed)
Anesthesia Post Note  Patient: Alan Edwards  Procedure(s) Performed: OPEN REDUCTION INTERNAL FIXATION (ORIF) RIGHT ANKLE FRACTURE (Right Ankle)     Patient location during evaluation: PACU Anesthesia Type: Regional and MAC Level of consciousness: awake and alert Pain management: pain level controlled Vital Signs Assessment: post-procedure vital signs reviewed and stable Respiratory status: spontaneous breathing, nonlabored ventilation, respiratory function stable and patient connected to nasal cannula oxygen Cardiovascular status: stable and blood pressure returned to baseline Postop Assessment: no apparent nausea or vomiting Anesthetic complications: no    Last Vitals:  Vitals:   01/14/20 1632 01/14/20 1647  BP: 111/68 130/75  Pulse: 67 75  Resp: 17 20  Temp:  37.1 C  SpO2: 94% 95%    Last Pain:  Vitals:   01/14/20 1647  TempSrc:   PainSc: 0-No pain                 Myquan Schaumburg DANIEL

## 2020-01-14 NOTE — Anesthesia Preprocedure Evaluation (Addendum)
Anesthesia Evaluation  Patient identified by MRN, date of birth, ID band Patient awake    Reviewed: Allergy & Precautions, NPO status , Patient's Chart, lab work & pertinent test results  Airway Mallampati: II  TM Distance: >3 FB Neck ROM: Full    Dental  (+) Poor Dentition, Dental Advisory Given, Missing, Loose,    Pulmonary Current Smoker and Patient abstained from smoking.,  hasnt smoked in 4 weeks   Pulmonary exam normal breath sounds clear to auscultation       Cardiovascular negative cardio ROS Normal cardiovascular exam Rhythm:Regular Rate:Normal     Neuro/Psych negative neurological ROS  negative psych ROS   GI/Hepatic negative GI ROS, (+)     substance abuse  alcohol use,   Endo/Other  negative endocrine ROS  Renal/GU negative Renal ROS  negative genitourinary   Musculoskeletal Right ankle bimalleolar fx   Abdominal Normal abdominal exam  (+)   Peds negative pediatric ROS (+)  Hematology negative hematology ROS (+)   Anesthesia Other Findings   Reproductive/Obstetrics negative OB ROS                          Anesthesia Physical Anesthesia Plan  ASA: II  Anesthesia Plan: MAC and Regional   Post-op Pain Management:  Regional for Post-op pain   Induction:   PONV Risk Score and Plan: 2 and Propofol infusion and TIVA  Airway Management Planned: Natural Airway and Simple Face Mask  Additional Equipment: None  Intra-op Plan:   Post-operative Plan:   Informed Consent: I have reviewed the patients History and Physical, chart, labs and discussed the procedure including the risks, benefits and alternatives for the proposed anesthesia with the patient or authorized representative who has indicated his/her understanding and acceptance.       Plan Discussed with: CRNA  Anesthesia Plan Comments:         Anesthesia Quick Evaluation

## 2020-01-14 NOTE — Transfer of Care (Signed)
Immediate Anesthesia Transfer of Care Note  Patient: Alan Edwards  Procedure(s) Performed: OPEN REDUCTION INTERNAL FIXATION (ORIF) RIGHT ANKLE FRACTURE (Right Ankle)  Patient Location: PACU  Anesthesia Type:MAC and Regional  Level of Consciousness: awake, oriented and patient cooperative  Airway & Oxygen Therapy: Patient Spontanous Breathing and Patient connected to face mask oxygen  Post-op Assessment: Report given to RN and Post -op Vital signs reviewed and stable  Post vital signs: Reviewed  Last Vitals:  Vitals Value Taken Time  BP 102/75 01/14/20 1602  Temp    Pulse 73 01/14/20 1602  Resp 18 01/14/20 1602  SpO2 100 % 01/14/20 1602  Vitals shown include unvalidated device data.  Last Pain:  Vitals:   01/14/20 1345  TempSrc:   PainSc: 3       Patients Stated Pain Goal: 3 (27/74/12 8786)  Complications: No apparent anesthesia complications

## 2020-01-14 NOTE — Anesthesia Procedure Notes (Signed)
Procedure Name: MAC Date/Time: 01/14/2020 2:35 PM Performed by: Jenne Campus, CRNA Pre-anesthesia Checklist: Patient identified, Emergency Drugs available, Suction available and Patient being monitored Oxygen Delivery Method: Simple face mask

## 2020-01-14 NOTE — Discharge Instructions (Signed)
Do not put weight on your right foot or ankle. Ice and elevation several times daily for swelling. Keep your splint clean and dry.

## 2020-01-14 NOTE — H&P (Signed)
Coal Nearhood is an 79 y.o. male.   Chief Complaint:  Right ankle pain with known fracture HPI: The patient is a 79 year old gentleman who injured his right ankle after a mechanical fall.  He was seen in the emergency room and found to have an unstable right ankle lateral malleolus fracture.  There is widening of the ankle mortise.  We then saw him in follow-up in clinic.  We have recommended surgery due to this being an unstable ankle fracture.  Past Medical History:  Diagnosis Date  . Medical history non-contributory     Past Surgical History:  Procedure Laterality Date  . COLONOSCOPY      History reviewed. No pertinent family history. Social History:  reports that he has been smoking cigars. He has never used smokeless tobacco. He reports current alcohol use of about 21.0 standard drinks of alcohol per week. He reports that he does not use drugs.  Allergies: No Known Allergies  Medications Prior to Admission  Medication Sig Dispense Refill  . diphenhydrAMINE (BENADRYL) 50 MG tablet Take 50 mg by mouth at bedtime.     Marland Kitchen etodolac (LODINE) 300 MG capsule Take 300 mg by mouth 2 (two) times daily after a meal.    . HYDROcodone-acetaminophen (NORCO/VICODIN) 5-325 MG tablet Take 1 tablet by mouth every 12 (twelve) hours as needed. 6 tablet 0    No results found for this or any previous visit (from the past 48 hour(s)). No results found.  Review of Systems  All other systems reviewed and are negative.   Blood pressure 132/72, pulse 89, temperature 98.4 F (36.9 C), temperature source Oral, resp. rate 18, height 5\' 11"  (1.803 m), weight 90.7 kg, SpO2 99 %. Physical Exam  Constitutional: He is oriented to person, place, and time. He appears well-developed and well-nourished.  HENT:  Head: Normocephalic and atraumatic.  Eyes: Pupils are equal, round, and reactive to light. EOM are normal.  Cardiovascular: Normal rate.  Respiratory: Effort normal.  GI: Soft.  Musculoskeletal:      Cervical back: Normal range of motion and neck supple.     Right ankle: Swelling, deformity and ecchymosis present. Tenderness present over the lateral malleolus and medial malleolus. Decreased range of motion.  Neurological: He is alert and oriented to person, place, and time.  Skin: Skin is warm.  Psychiatric: He has a normal mood and affect.     Assessment/Plan Right ankle with unstable lateral malleolus fracture and widening of the ankle mortise  The patient understands that our plan is to proceed to surgery today for open reduction/internal fixation of his right ankle lateral malleolus to better align the fracture and ankle mortise.  We had a long and thorough discussion in the office about the risk and benefits of the surgery as well as the rationale for our recommendation for surgery.  , MD 01/14/2020, 11:27 AM

## 2020-01-14 NOTE — Progress Notes (Signed)
Care assumed post block. Patient alert and oriented x 4. Skin warm and dry. Resp. even and unlabored.

## 2020-01-14 NOTE — Op Note (Signed)
NAMEJABARI, Alan Edwards MEDICAL RECORD OJ:[id number] ACCOUNT [id number] DATE OF BIRTH:1941-04-18 FACILITY: [facility] LOCATION: [facility] PHYSICIAN:Caton Popowski Aretha Parrot, MD  OPERATIVE REPORT  DATE OF PROCEDURE:  01/14/2020  PREOPERATIVE  DIAGNOSIS:   Right ankle unstable lateral malleolus fracture.  POSTOPERATIVE DIAGNOSES:   1.  Right ankle unstable lateral malleolus fracture. 2.  Disruption of the ankle syndesmosis.  PROCEDURE: 1.  Open reduction internal fixation of right ankle lateral malleolus fracture. 2.  Open reduction and stabilization of the ankle syndesmosis.  IMPLANTS:   1.  A Stryker 4-hole VariAx fibular locking plate and screws. 2.  Two syndesmosis screws.  SURGEON:  Vanita Panda.  Magnus Ivan, MD  ASSISTANT:  Richardean Canal, PA-C  ANESTHESIA: 1.  Right lower extremity regional block. 2.  Mask ventilation, IV sedation.  TOURNIQUET TIME:  Under 1 hour.  ESTIMATED BLOOD LOSS:  Less than 50 mL.  COMPLICATIONS:  None.  ANTIBIOTICS:  Two g IV Ancef.  INDICATIONS:  The patient is a 79 year old gentleman who sustained a mechanical fall when he slipped in his driveway over a week ago.  He was seen in our office and found to have a lateral malleolus fracture with widening of the ankle mortise.  This was  an unstable fracture.  He had been seen for this in the emergency room as well and they were appropriately placed him in a splint.  We have advised him to stay off his ankle with elevation and nonweightbearing.  He presents for definitive fixation today.   A long discussion with him about the surgery, indications was had.  We talked about the risks and benefits of surgery in detail and showed him a model and his x-rays, describing why we need to fix his ankle.  DESCRIPTION OF PROCEDURE:  After informed consent was obtained and appropriate right ankle was marked, anesthesia obtained a regional block in the holding room.  He was then brought to the operating room and  placed supine on the operating table.  Mask  ventilation, IV sedation was obtained.  A nonsterile tourniquet was placed around his upper right thigh and his right knee, leg, ankle and foot were prepped and draped with DuraPrep and sterile drapes.  Of note, there was significant wear on his splint,  so obviously, he has been putting some weight on this.  There were also medial fracture blisters which we unroofed for placing Xeroform later.  Fortunately, we did not have to perform a surgery on the medial side.  His lateral side showed swelling, but  no blistering.  A time-out was called.  He was identified, correct right ankle.  We then used an Esmarch to wrap that leg and tourniquet was inflated to 300 mm of pressure.  I then made a lateral incision over the fibula and carried this proximally and  distally, dissected down to the fibular fracture itself and was able to identify the fracture.  We cleaned soft tissue debris and hematoma from this.  Using temporary reduction forceps, we were then able to reduce the fibula in a anatomic position and I  verified this under direct visualization and fluoroscopy.  I then placed a lag screw from anterior to posterior across the fracture, securing this.  We then chose our Stryker VariAx 4-hole locking plate and fashioned this along the lateral cortex of the  femur.  We secured this with bicortical screws proximally and locking screws distally.  I stressed the ankle syndesmosis and we felt that there was some still tilt of the talus  and some slight instability.  We put the ankle in a dorsiflexed position and  decided to pass 2 syndesmosis screws.  We passed them from lateral to medial and it was definitely posterolateral to anteromedial.  We put our 2 syndesmosis screws, capturing just 3 cortices with each screw.  We were then pleased with stability of the  ankle assessed clinically and radiographically.  We then irrigated the soft tissue with normal saline solution.  We  closed the deep tissue over the plate with 0 Vicryl, followed by 2-0 Vicryl to close the subcutaneous tissue and interrupted 2-0 nylon to  close the skin.  Xeroform was placed over the incision as well as the fracture blisters.  A well-padded plaster splint was placed with a posterior component and stirrup.  Tourniquet was let down.  His toes pinkened nicely.  He was taken to the recovery  room in stable condition with all final counts being correct.  There were no complications noted.  Postoperatively, he will be discharged later from the PACU and we will have him strict nonweightbearing on his right lower extremity until followup in the  office in 2 weeks.  Of note, Benita Stabile, PA-C did assist during the entire case.  Her assistance was crucial for facilitating all aspects of this case.  VN/NUANCE  D:01/14/2020 T:01/14/2020 JOB:[id number]

## 2020-01-15 ENCOUNTER — Telehealth: Payer: Self-pay | Admitting: Radiology

## 2020-01-15 NOTE — Telephone Encounter (Signed)
I spoke to him.

## 2020-01-15 NOTE — Telephone Encounter (Signed)
See below

## 2020-01-15 NOTE — Telephone Encounter (Signed)
Patient called triage line and states that he has no feeling in his foot and is unable to move his toes. He had surgery yesterday.  He would like a call back to advise.  CB for pt [phone]

## 2020-01-16 ENCOUNTER — Encounter: Payer: Self-pay | Admitting: *Deleted

## 2020-01-16 NOTE — Telephone Encounter (Signed)
noted 

## 2020-01-28 ENCOUNTER — Encounter: Payer: Self-pay | Admitting: Orthopaedic Surgery

## 2020-01-28 ENCOUNTER — Ambulatory Visit (INDEPENDENT_AMBULATORY_CARE_PROVIDER_SITE_OTHER): Payer: No Typology Code available for payment source

## 2020-01-28 ENCOUNTER — Other Ambulatory Visit: Payer: Self-pay

## 2020-01-28 ENCOUNTER — Ambulatory Visit (INDEPENDENT_AMBULATORY_CARE_PROVIDER_SITE_OTHER): Payer: Non-veteran care | Admitting: Orthopaedic Surgery

## 2020-01-28 DIAGNOSIS — Z9889 Other specified postprocedural states: Secondary | ICD-10-CM

## 2020-01-28 DIAGNOSIS — Z8781 Personal history of (healed) traumatic fracture: Secondary | ICD-10-CM | POA: Diagnosis not present

## 2020-01-28 DIAGNOSIS — S8261XD Displaced fracture of lateral malleolus of right fibula, subsequent encounter for closed fracture with routine healing: Secondary | ICD-10-CM

## 2020-01-28 NOTE — Progress Notes (Signed)
Patient is a very pleasant 79 year old gentleman who is now 2 weeks status post open reduction-internal fixation of a right ankle lateral malleolus fracture.  We also stabilize the syndesmosis with 2 syndesmosis screws.  He has been nonweightbearing using a knee scooter.  He reports minimal pain and said he is doing well overall.  On exam, we removed the sutures and placed Steri-Strips.  The incision itself looks good.  His foot is well-perfused with appropriate bruising but no severe swelling.  He is able to gently move his toes and ankle.  3 views of the right ankle are obtained and reviewed.  The ankle mortise is congruent and intact.  The hardware looks good with no evidence of failure.  The syndesmosis screws are intact.  There is appropriate overlap on the AP view of the fibula and tibia.  There is previous posttraumatic changes around the medial malleolus that is old.  At this point we will let him attempt weightbearing as tolerated in a cam walking boot.  We will see him back in 4 weeks with a repeat 3 views of his right ankle.  He will go slow with his mobility.  He understands that the syndesmosis screws will likely break with time that we would not remove these given his age and level of activity and he understands that as well.  All question concerns were answered and addressed.  We will see him back in 4 weeks.

## 2020-03-02 ENCOUNTER — Encounter: Payer: Self-pay | Admitting: Orthopaedic Surgery

## 2020-03-02 ENCOUNTER — Other Ambulatory Visit: Payer: Self-pay

## 2020-03-02 ENCOUNTER — Ambulatory Visit (INDEPENDENT_AMBULATORY_CARE_PROVIDER_SITE_OTHER): Payer: No Typology Code available for payment source | Admitting: Orthopaedic Surgery

## 2020-03-02 ENCOUNTER — Ambulatory Visit (INDEPENDENT_AMBULATORY_CARE_PROVIDER_SITE_OTHER): Payer: No Typology Code available for payment source

## 2020-03-02 DIAGNOSIS — Z9889 Other specified postprocedural states: Secondary | ICD-10-CM

## 2020-03-02 DIAGNOSIS — S8261XD Displaced fracture of lateral malleolus of right fibula, subsequent encounter for closed fracture with routine healing: Secondary | ICD-10-CM

## 2020-03-02 DIAGNOSIS — Z8781 Personal history of (healed) traumatic fracture: Secondary | ICD-10-CM | POA: Diagnosis not present

## 2020-03-02 NOTE — Progress Notes (Signed)
The patient is now almost 7 weeks status post open reduction and fixation of a complex right ankle fracture.  He has been weightbearing as tolerated in a cam walking boot and using his cane.  He states that his range of motion and strength are improving.  He has been full weightbearing.  Examination of his right ankle shows the lateral incision is healed nicely.  There is still residual ankle and foot swelling.  There is no evidence of infection.  He has a limited flexion and extension arc of the ankle.  It is ligamentously stable.  There is no medial tenderness.  3 views of the right ankle are obtained and show an intact and congruent ankle mortise.  The hardware itself is intact.  There is been significant interval healing of the fracture.  At this point we will transition him to an ASO with continued weightbearing as tolerated.  I have offered him physical therapy for his ankle.  He states that his age of 13 that he is fine without physical therapy and he feels like he can improve without it as well.  We will allow him to continue weightbearing as tolerated and I will see him back in 4 weeks with a repeat 3 views of his right ankle.  All questions and concerns were answered and addressed.

## 2020-03-30 ENCOUNTER — Other Ambulatory Visit: Payer: Self-pay

## 2020-03-30 ENCOUNTER — Ambulatory Visit (INDEPENDENT_AMBULATORY_CARE_PROVIDER_SITE_OTHER): Payer: No Typology Code available for payment source | Admitting: Orthopaedic Surgery

## 2020-03-30 ENCOUNTER — Ambulatory Visit (INDEPENDENT_AMBULATORY_CARE_PROVIDER_SITE_OTHER): Payer: No Typology Code available for payment source

## 2020-03-30 ENCOUNTER — Encounter: Payer: Self-pay | Admitting: Orthopaedic Surgery

## 2020-03-30 DIAGNOSIS — Z9889 Other specified postprocedural states: Secondary | ICD-10-CM

## 2020-03-30 DIAGNOSIS — Z8781 Personal history of (healed) traumatic fracture: Secondary | ICD-10-CM

## 2020-03-30 DIAGNOSIS — S8261XD Displaced fracture of lateral malleolus of right fibula, subsequent encounter for closed fracture with routine healing: Secondary | ICD-10-CM

## 2020-03-30 NOTE — Progress Notes (Signed)
The patient is a very active 79 year old gentleman who is 10 weeks status post open reduction/internal fixation of a complex right ankle fracture.  We fixed the lateral malleolus and had to place syndesmosis screws.  He does ambulate using a cane.  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  He says his foot and ankle are doing better and is getting better in the regular shoes and working outside.  On exam his incision is healed nicely over his right ankle.  He does have some limitations of flexion and extension given the nature of his fracture and syndesmosis screws but overall he looks good.  He is neurovascularly intact.  3 views of the right ankle are obtained and show the fracture is healing nicely.  There is no evidence of hardware failure.  The syndesmosis screws are intact and the ankle mortise is well aligned.  At this point since he is doing so well follow-up can be as needed.  He understands the screws may break and do not be concerned about that.  He understands that if there is any issues at all he can always come back and see [place].  I did show him to have strengthening exercises to try.  We offered him physical therapy but he says he does not need it at his age and he will still keep mowing lawns.

## 2021-01-07 IMAGING — CR DG ANKLE COMPLETE 3+V*R*
3 series · 3 of 3 positions shown · non-contrast
Comparison: None.

CLINICAL DATA: Acute RIGHT ankle pain and swelling following fall.
Initial encounter.

EXAM:
RIGHT ANKLE - COMPLETE 3+ VIEW

[x ankle ap right]
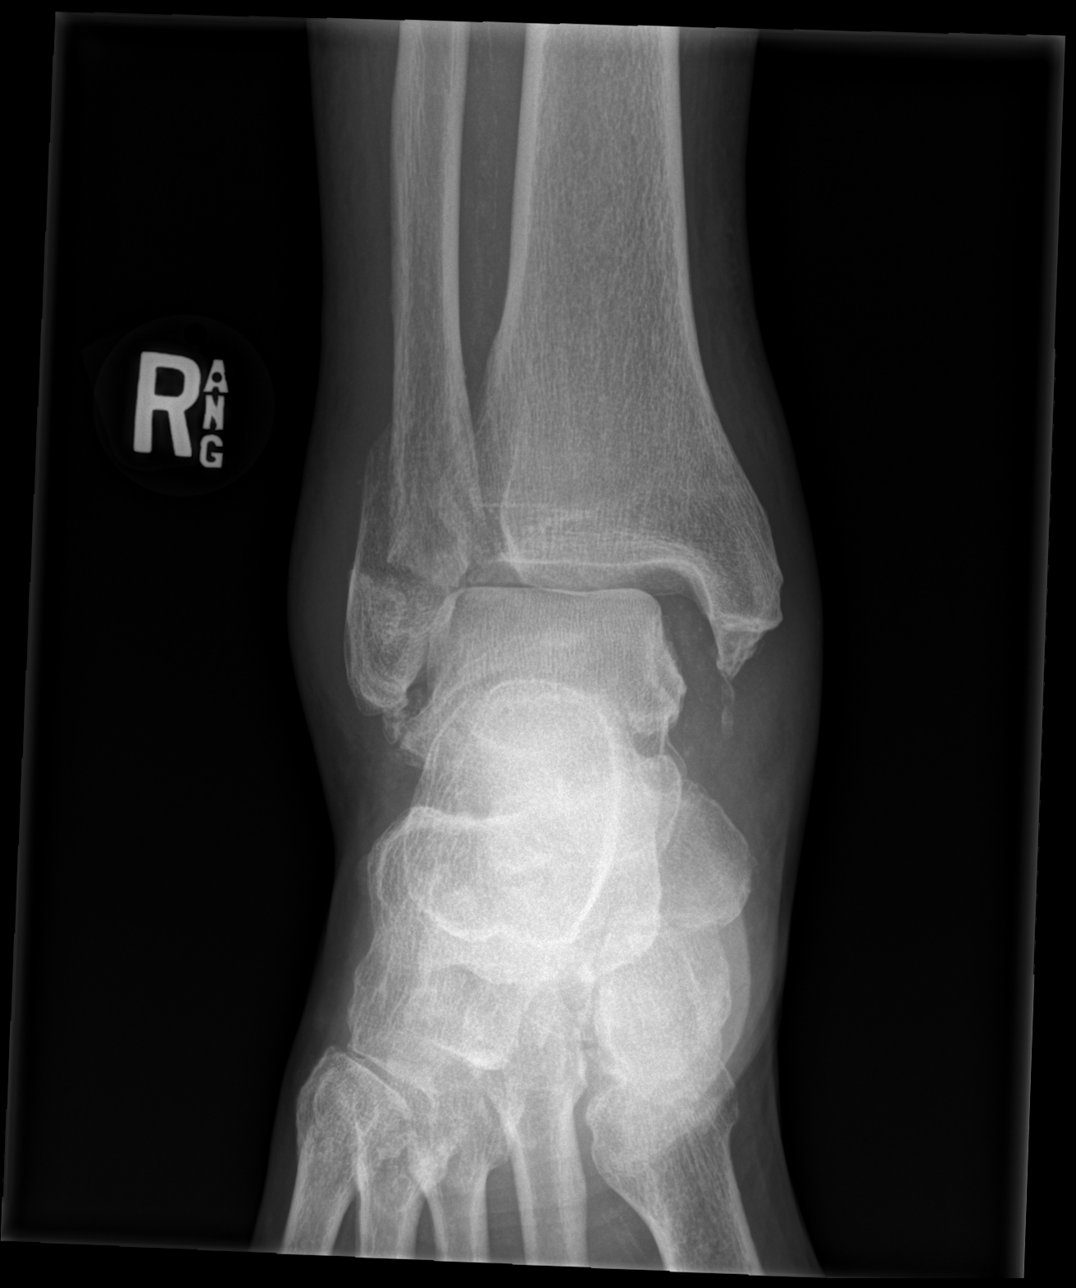

[x ankle obl right]
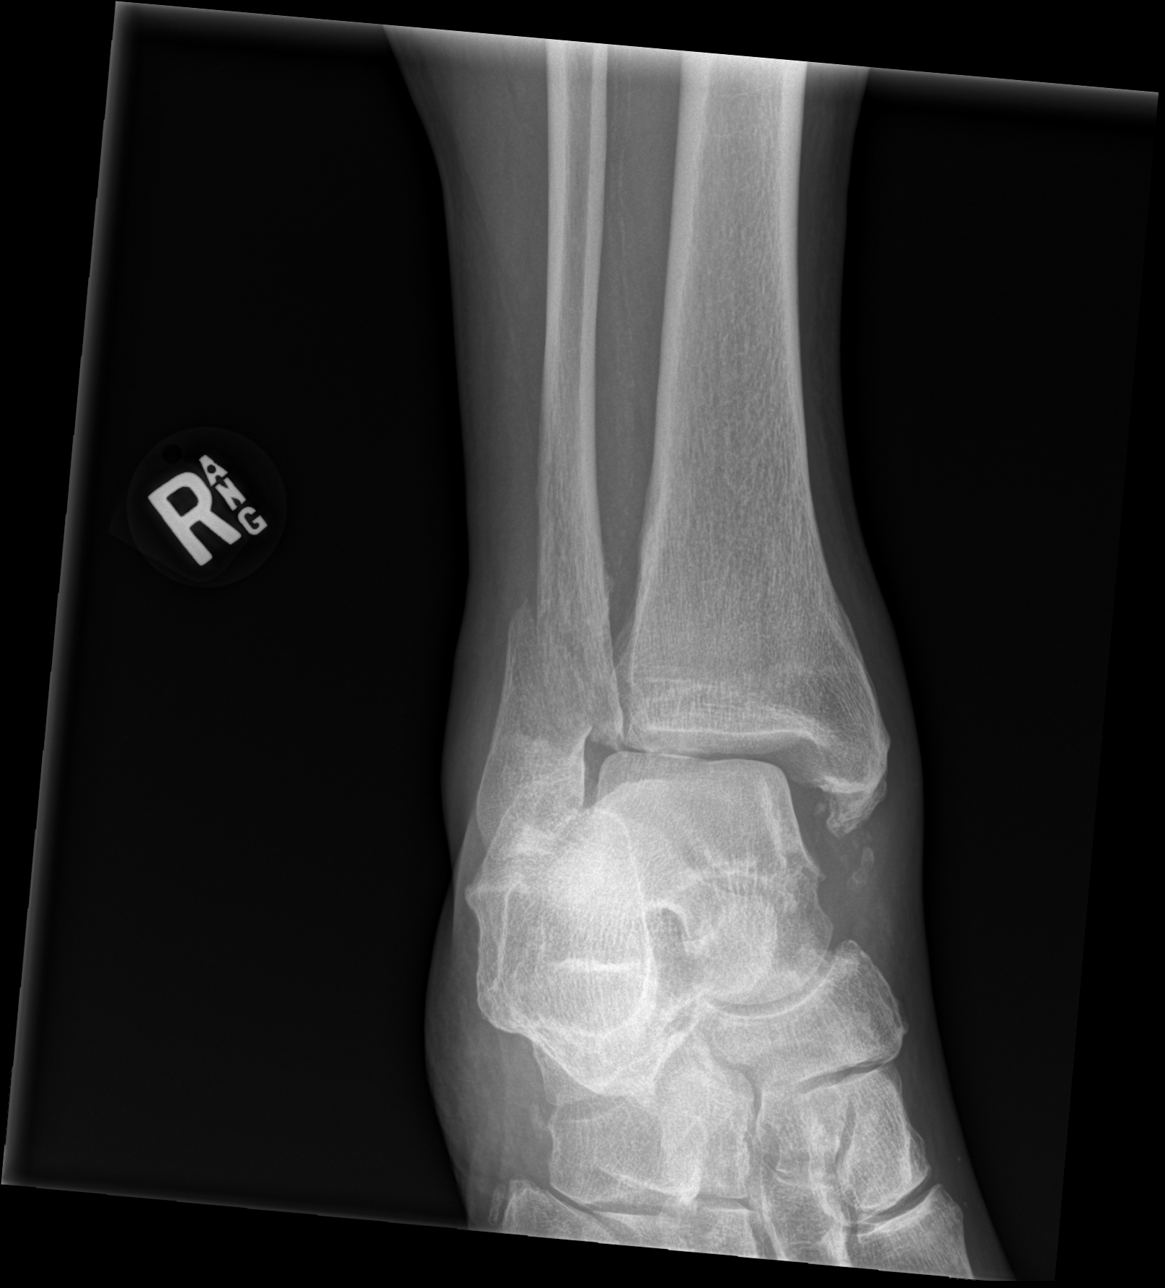

[x ankle lat right]
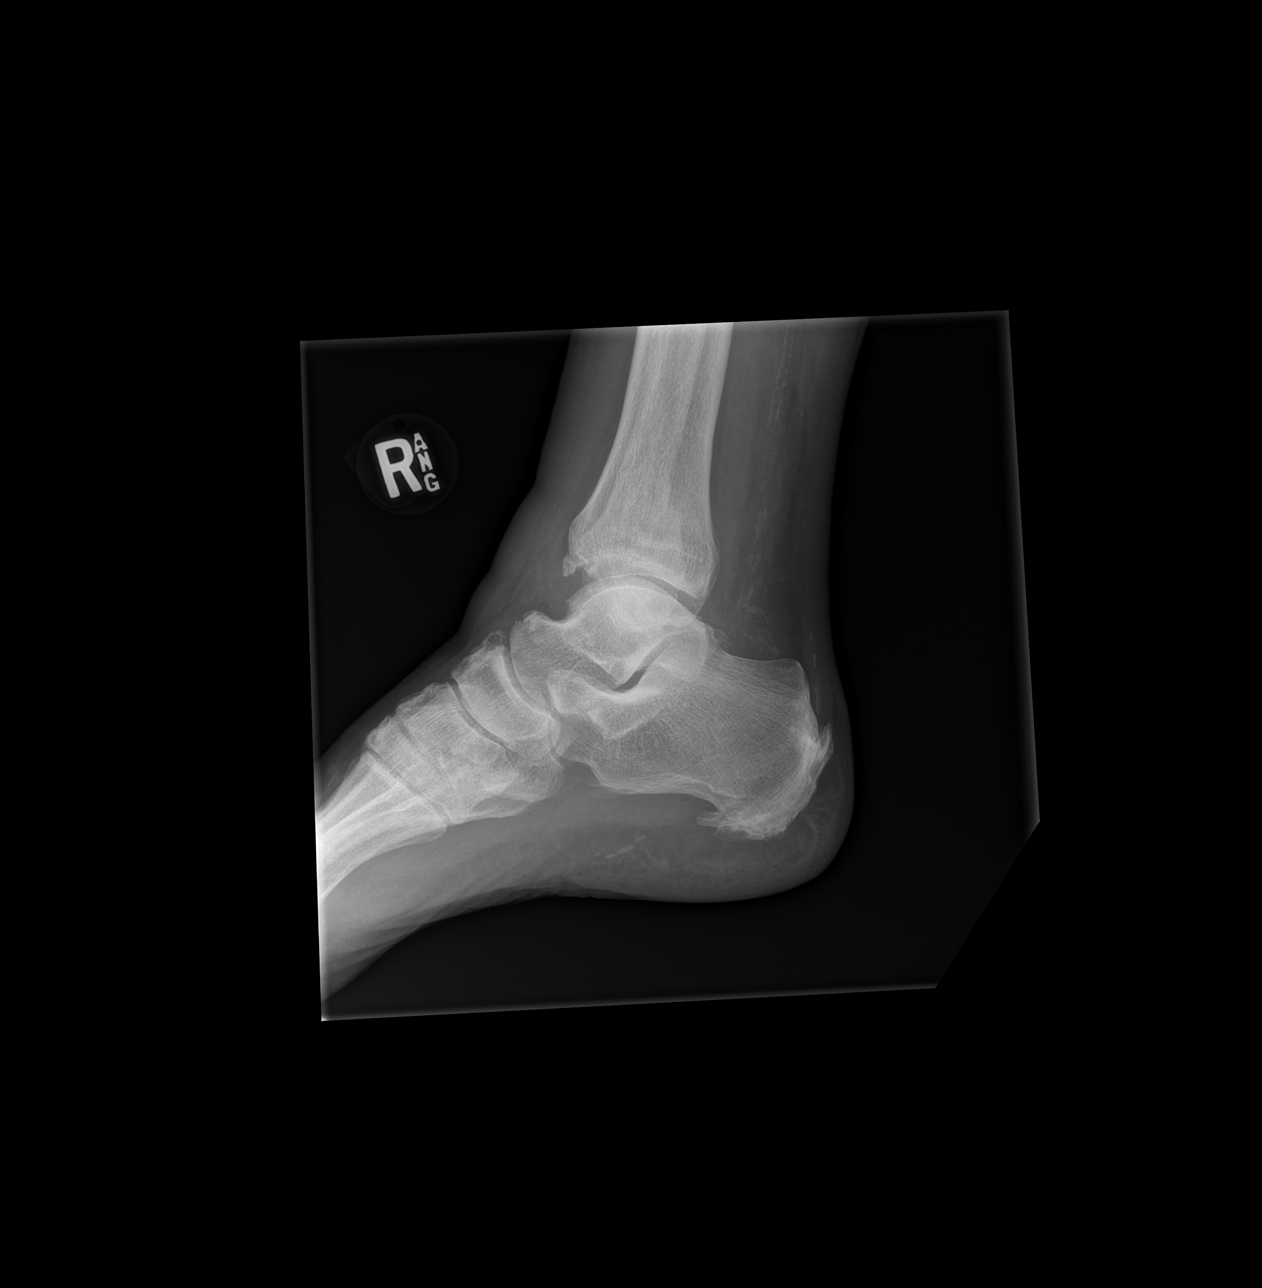

[3 of 3 positions shown; findings below may reference images not displayed]

FINDINGS: An oblique fracture of the distal fibula is noted with 5 mm LATERAL
displacement.

A probable nondisplaced transverse fracture of the MEDIAL malleolus
is noted.

4 mm LATERAL subluxation at the tibiotalar joint is noted.

Associated soft tissue swelling is noted.
IMPRESSION: 1. Oblique fracture of the distal fibula with 5 mm LATERAL
displacement and probable nondisplaced transverse fracture of the
MEDIAL malleolus.
2. 4 mm LATERAL subluxation at the tibiotalar joint.

## 2021-01-07 IMAGING — CR DG FOOT COMPLETE 3+V*R*
3 series · 3 of 3 positions shown · non-contrast
Comparison: None.

CLINICAL DATA: Acute RIGHT ankle pain and swelling following fall.
Initial encounter.

EXAM:
RIGHT FOOT COMPLETE - 3+ VIEW

[x foot lat right]
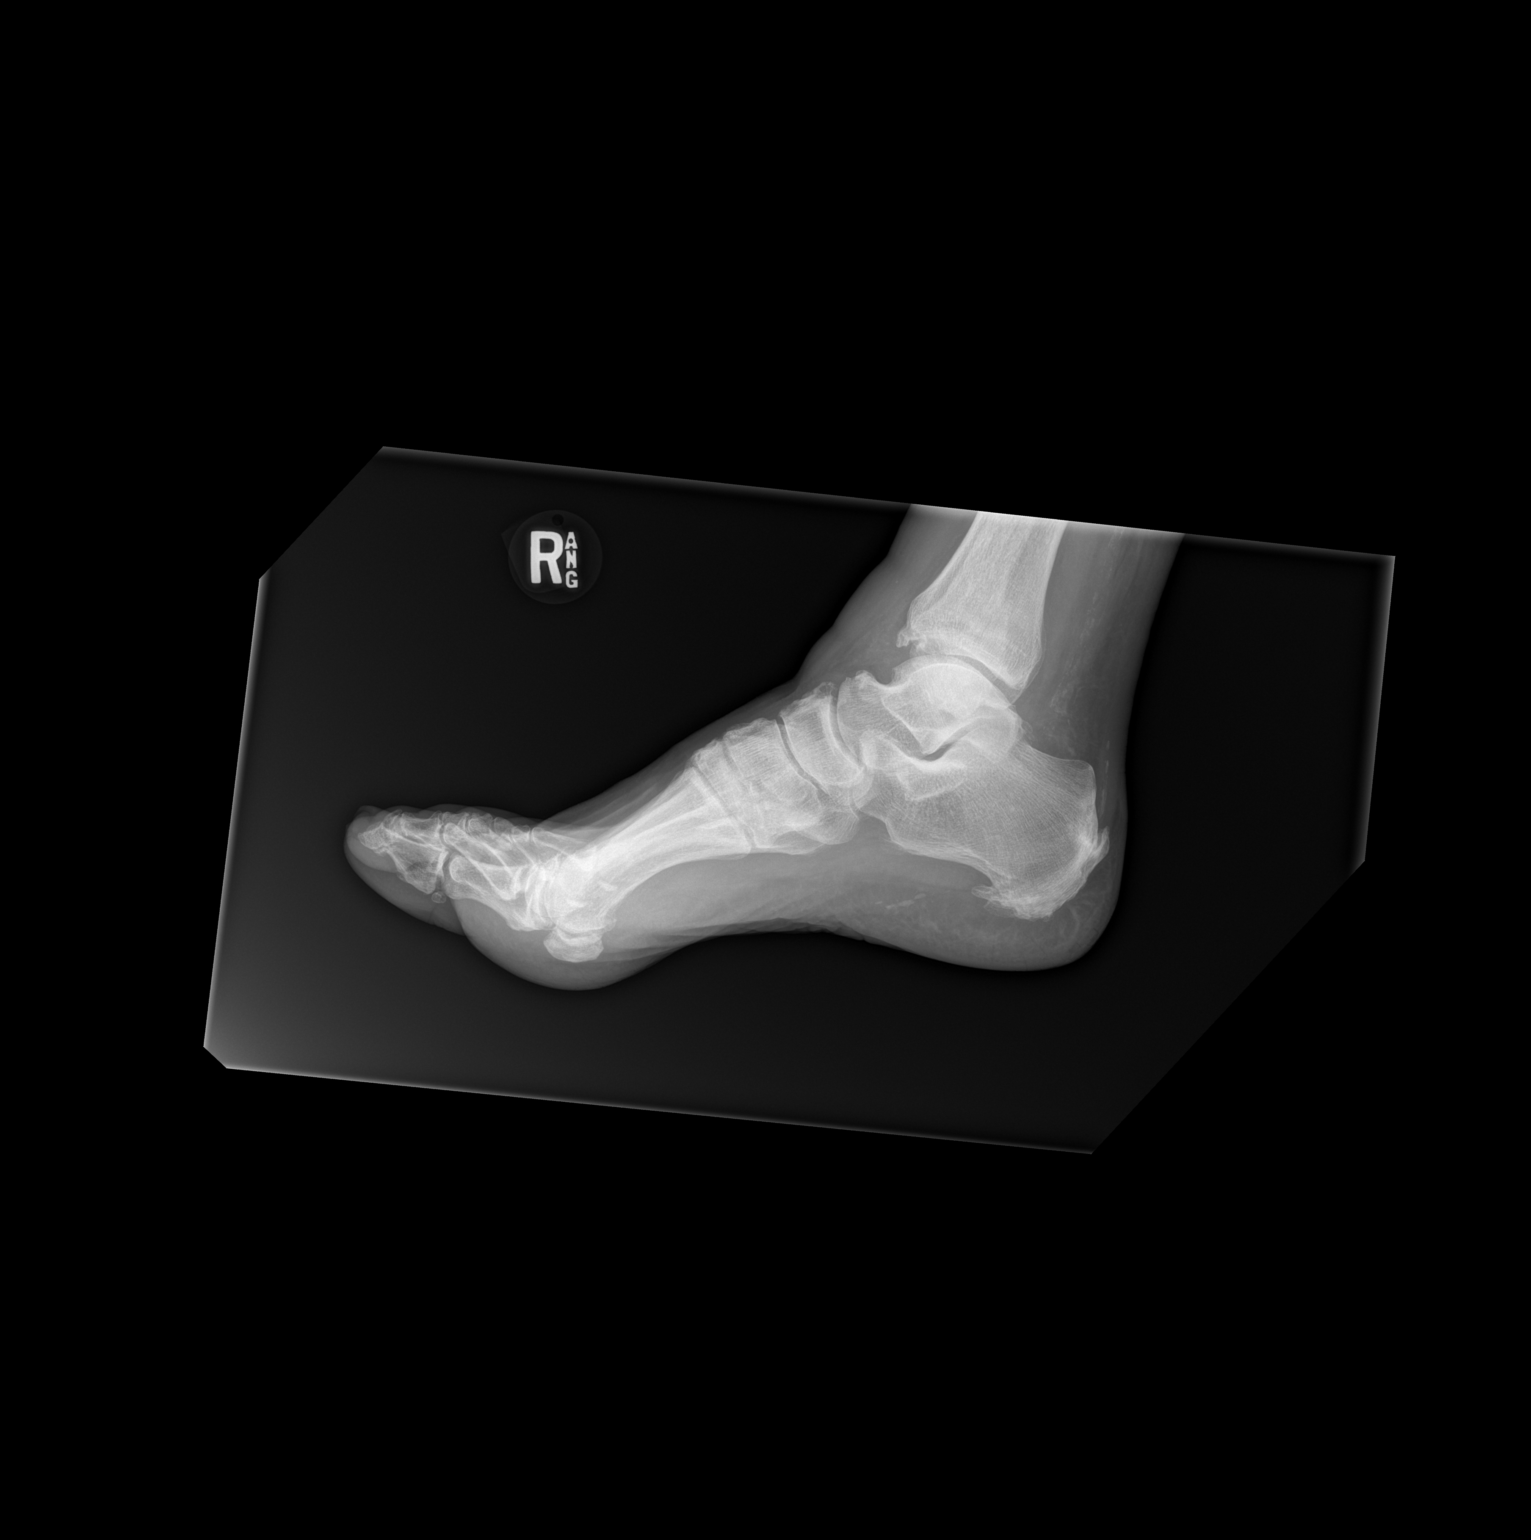

[x foot ap right]
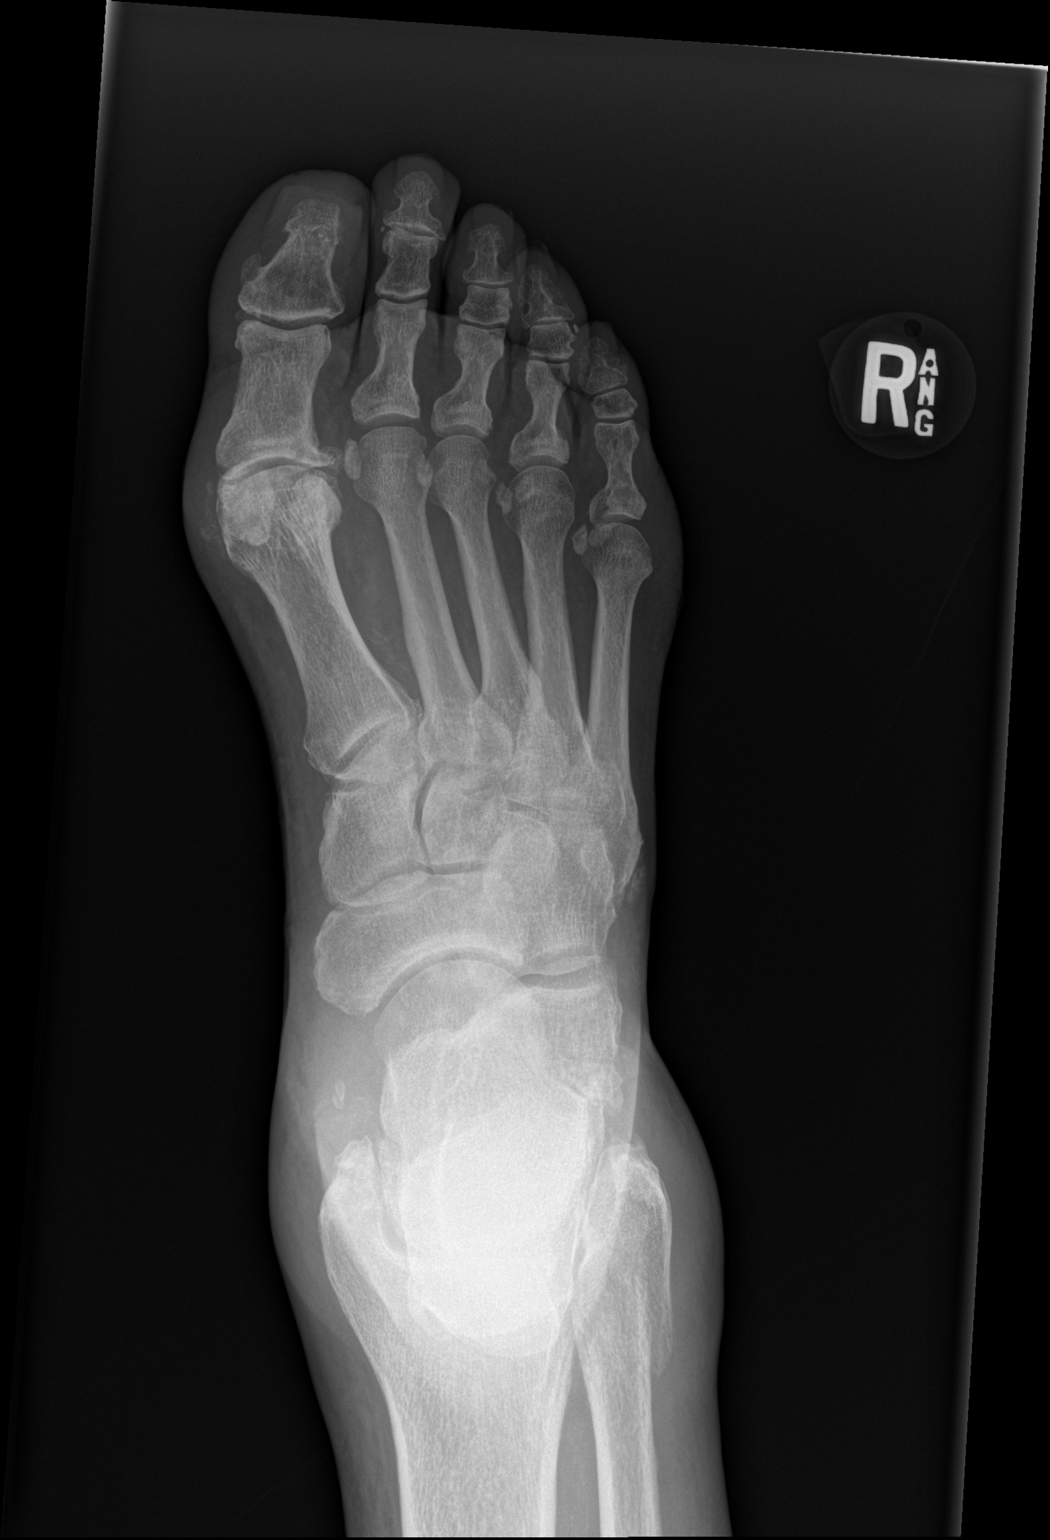

[x foot obl right]
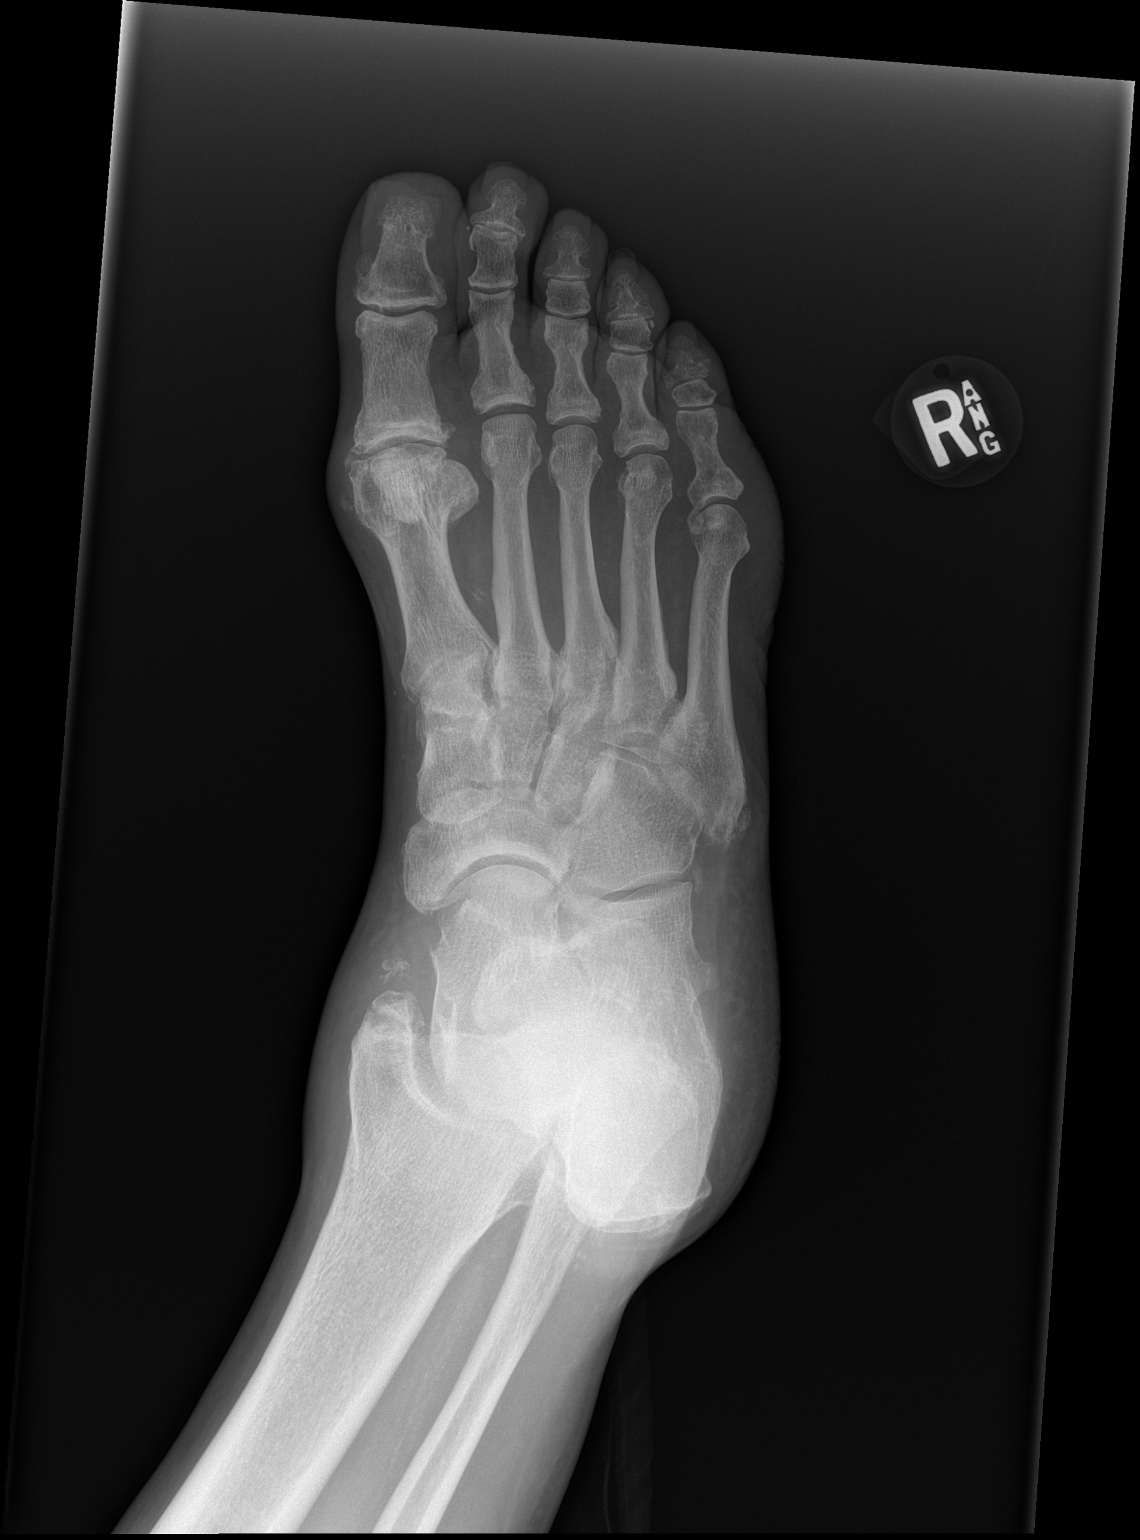

[3 of 3 positions shown; findings below may reference images not displayed]

FINDINGS: An oblique fracture of the distal fibula and a transverse fracture
of the medial malleolus is visualized on the edge of the film.

No other acute fracture, subluxation or dislocation identified.

Degenerative changes in the midfoot and 1st MTP joint noted.

The Lisfranc joints appear intact.
IMPRESSION: 1. Distal fibular and tibial fractures which will be further
discussed on the ankle radiographs.
2. Degenerative changes in the midfoot and 1st MTP joint.

## 2021-01-16 IMAGING — RF DG ANKLE COMPLETE 3+V*R*
1 series · 3 of 3 positions shown · non-contrast
Comparison: 01/05/2020

CLINICAL DATA: Ankle surgery

EXAM:
RIGHT ANKLE - COMPLETE 3+ VIEW; DG C-ARM 1-60 MIN

[Series 1: unknown protocol · 0.14mm/px · 3 of 3 slices shown]
[im 1/3]
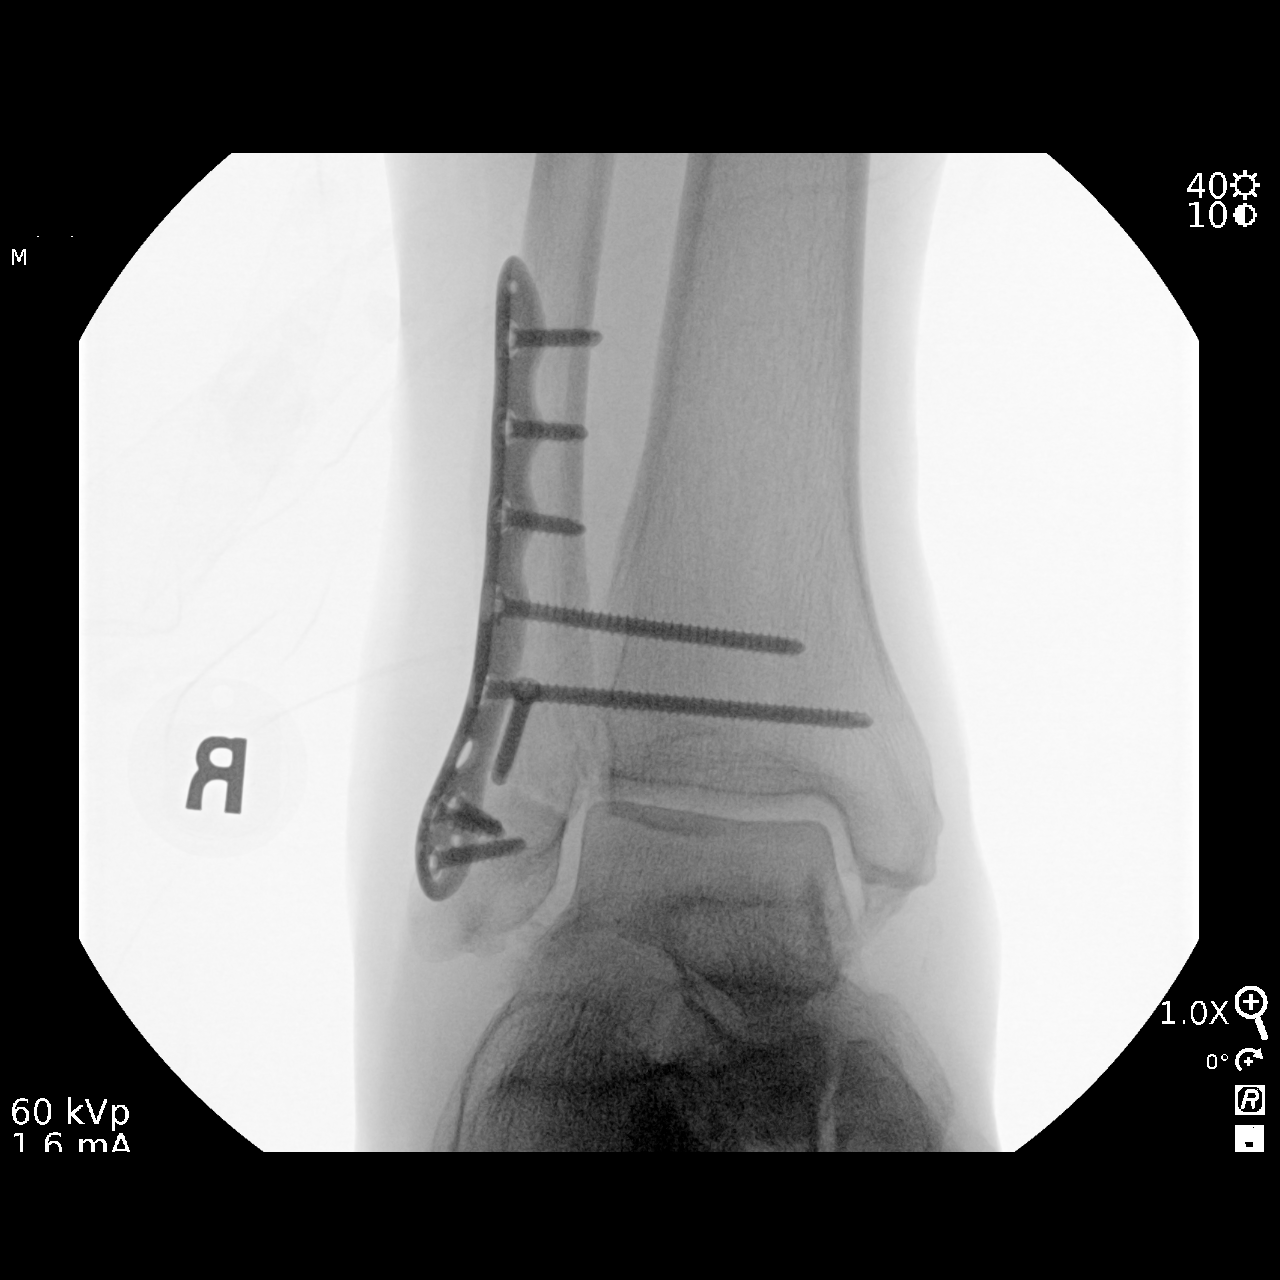
[im 2/3]
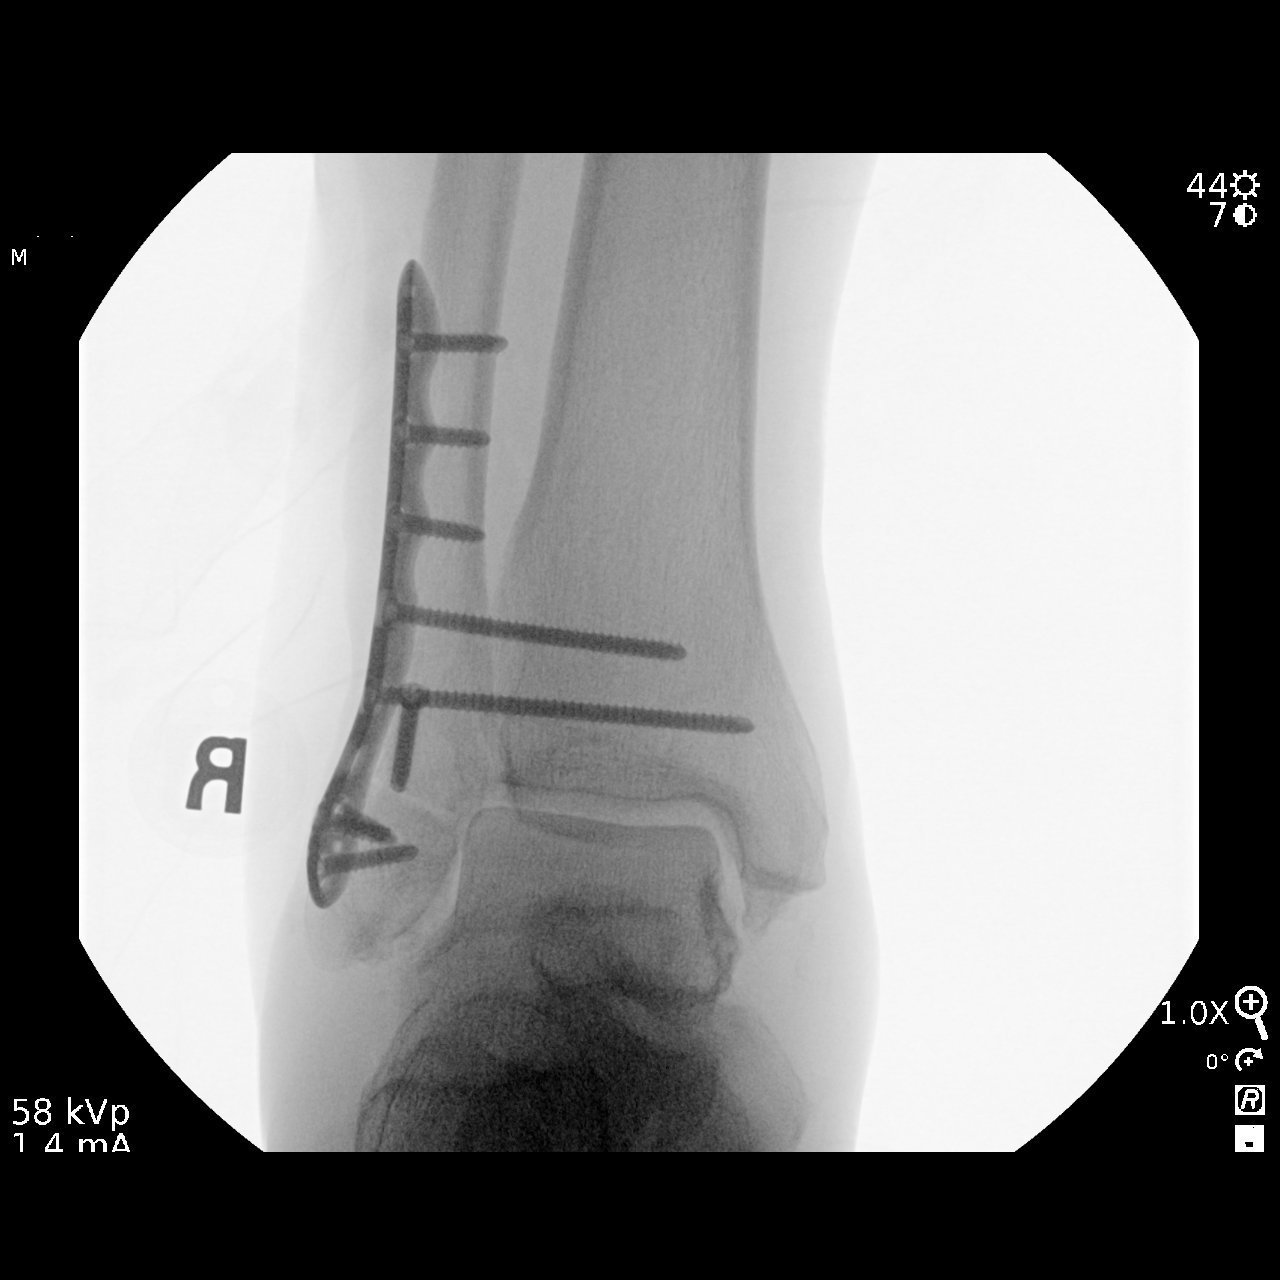
[im 3/3]
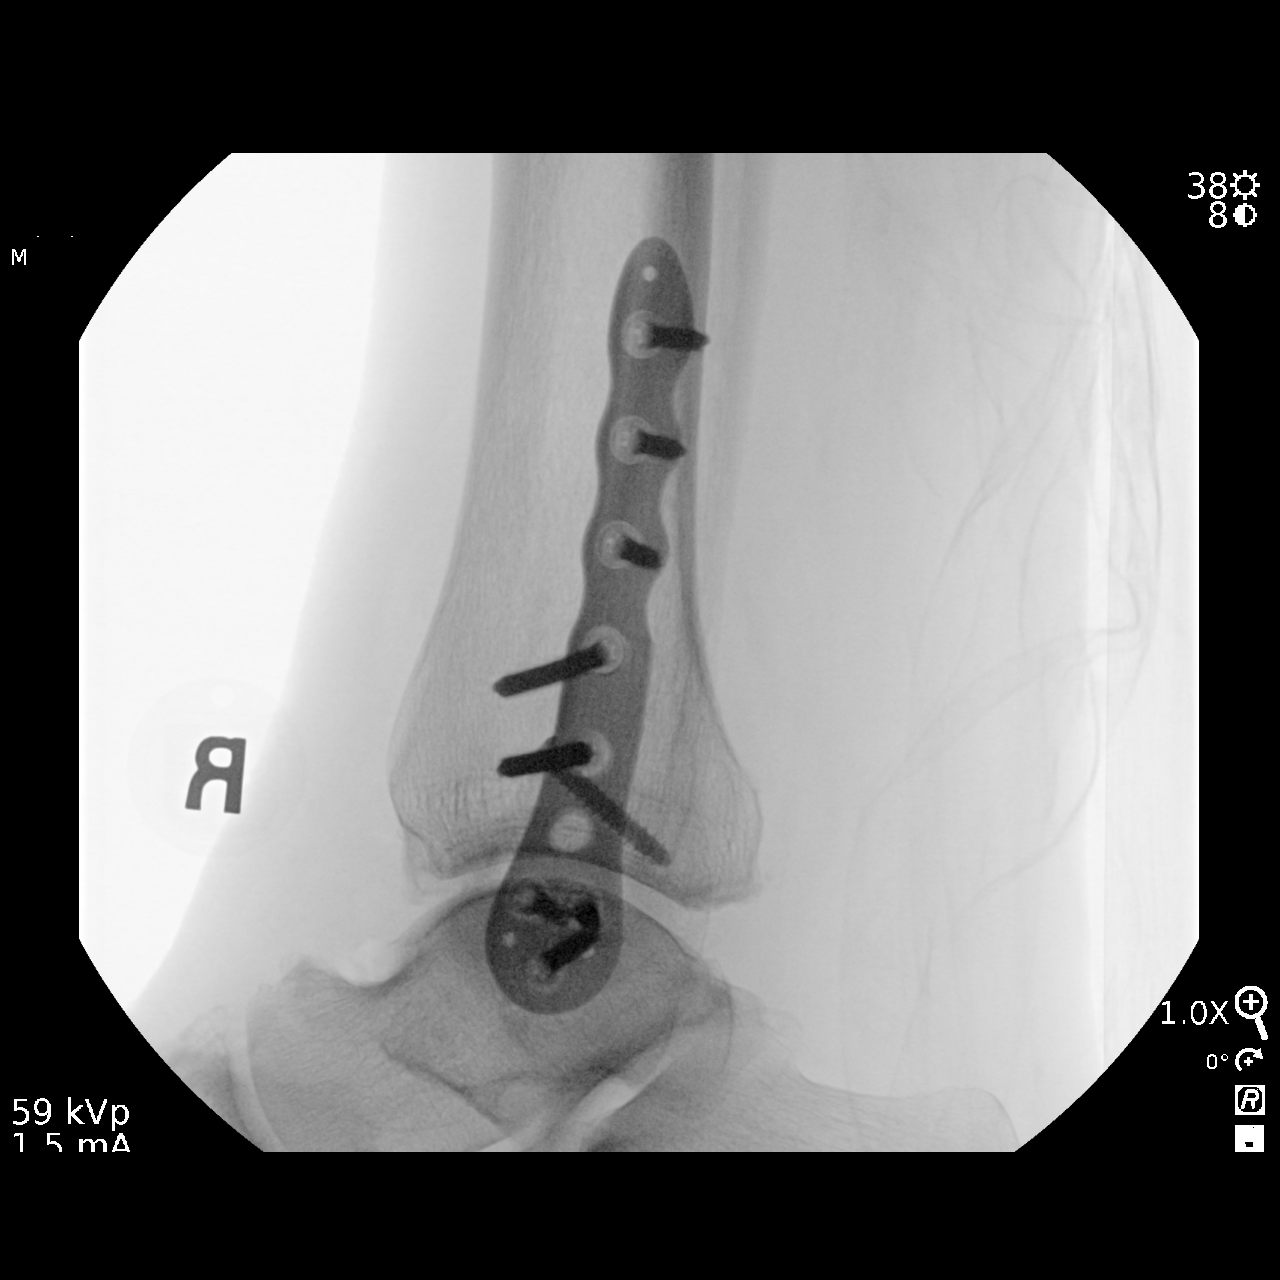

[3 of 3 positions shown; findings below may reference images not displayed]

FINDINGS: Three low resolution intraoperative spot views of the right ankle.
Total fluoroscopy time was 20 seconds. The images demonstrate
surgical plate and multiple screw fixation of distal fibular
fracture with anatomic alignment. Long fixating screws about the
distal tibiofibular articulation.
IMPRESSION: Intraoperative fluoroscopic assistance provided during surgical
fixation of distal fibular fracture

## 2021-03-17 ENCOUNTER — Other Ambulatory Visit: Payer: Self-pay

## 2021-03-17 ENCOUNTER — Ambulatory Visit (HOSPITAL_COMMUNITY)
Admission: EM | Admit: 2021-03-17 | Discharge: 2021-03-17 | Disposition: A | Discharge status: Other Acute Inpatient Hospital | Payer: Non-veteran care

## 2021-03-17 ENCOUNTER — Encounter (HOSPITAL_COMMUNITY): Payer: Self-pay

## 2021-03-17 ENCOUNTER — Emergency Department (HOSPITAL_COMMUNITY)
Admission: EM | Admit: 2021-03-17 | Discharge: 2021-03-21 | Disposition: A | Discharge status: Home / Self Care | Payer: No Typology Code available for payment source | Attending: Emergency Medicine | Admitting: Emergency Medicine

## 2021-03-17 DIAGNOSIS — R45851 Suicidal ideations: Secondary | ICD-10-CM | POA: Diagnosis not present

## 2021-03-17 DIAGNOSIS — F4310 Post-traumatic stress disorder, unspecified: Secondary | ICD-10-CM | POA: Diagnosis not present

## 2021-03-17 DIAGNOSIS — Z87898 Personal history of other specified conditions: Secondary | ICD-10-CM | POA: Diagnosis not present

## 2021-03-17 DIAGNOSIS — Z20822 Contact with and (suspected) exposure to covid-19: Secondary | ICD-10-CM | POA: Insufficient documentation

## 2021-03-17 DIAGNOSIS — F4323 Adjustment disorder with mixed anxiety and depressed mood: Secondary | ICD-10-CM

## 2021-03-17 DIAGNOSIS — Y900 Blood alcohol level of less than 20 mg/100 ml: Secondary | ICD-10-CM | POA: Insufficient documentation

## 2021-03-17 DIAGNOSIS — F99 Mental disorder, not otherwise specified: Secondary | ICD-10-CM | POA: Diagnosis present

## 2021-03-17 LAB — CBC WITH DIFFERENTIAL/PLATELET
Abs Immature Granulocytes: 0.02 K/uL (ref 0.00–0.07)
Basophils Absolute: 0.1 K/uL (ref 0.0–0.1)
Basophils Relative: 1 %
Eosinophils Absolute: 0.1 K/uL (ref 0.0–0.5)
Eosinophils Relative: 1 %
HCT: 47.8 % (ref 39.0–52.0)
Hemoglobin: 16.5 g/dL (ref 13.0–17.0)
Immature Granulocytes: 0 %
Lymphocytes Relative: 20 %
Lymphs Abs: 1.2 K/uL (ref 0.7–4.0)
MCH: 34.2 pg — ABNORMAL HIGH (ref 26.0–34.0)
MCHC: 34.5 g/dL (ref 30.0–36.0)
MCV: 99 fL (ref 80.0–100.0)
Monocytes Absolute: 0.5 K/uL (ref 0.1–1.0)
Monocytes Relative: 8 %
Neutro Abs: 4.2 K/uL (ref 1.7–7.7)
Neutrophils Relative %: 70 %
Platelets: 200 K/uL (ref 150–400)
RBC: 4.83 MIL/uL (ref 4.22–5.81)
RDW: 12.4 % (ref 11.5–15.5)
WBC: 6 K/uL (ref 4.0–10.5)
nRBC: 0 % (ref 0.0–0.2)

## 2021-03-17 LAB — COMPREHENSIVE METABOLIC PANEL WITH GFR
ALT: 26 U/L (ref 0–44)
AST: 28 U/L (ref 15–41)
Albumin: 4.7 g/dL (ref 3.5–5.0)
Alkaline Phosphatase: 64 U/L (ref 38–126)
Anion gap: 12 (ref 5–15)
BUN: 14 mg/dL (ref 8–23)
CO2: 24 mmol/L (ref 22–32)
Calcium: 9.8 mg/dL (ref 8.9–10.3)
Chloride: 102 mmol/L (ref 98–111)
Creatinine, Ser: 1.15 mg/dL (ref 0.61–1.24)
GFR, Estimated: >60 mL/min
Glucose, Bld: 107 mg/dL — ABNORMAL HIGH (ref 70–99)
Potassium: 4.5 mmol/L (ref 3.5–5.1)
Sodium: 138 mmol/L (ref 135–145)
Total Bilirubin: 0.9 mg/dL (ref 0.3–1.2)
Total Protein: 7.9 g/dL (ref 6.5–8.1)

## 2021-03-17 LAB — URINALYSIS, ROUTINE W REFLEX MICROSCOPIC
Bilirubin Urine: NEGATIVE
Glucose, UA: NEGATIVE mg/dL
Hgb urine dipstick: NEGATIVE
Ketones, ur: NEGATIVE mg/dL
Leukocytes,Ua: NEGATIVE
Nitrite: NEGATIVE
Protein, ur: NEGATIVE mg/dL
Specific Gravity, Urine: 1.014 (ref 1.005–1.030)
pH: 5 (ref 5.0–8.0)

## 2021-03-17 LAB — SALICYLATE LEVEL: Salicylate Lvl: <7 mg/dL — ABNORMAL LOW (ref 7.0–30.0)

## 2021-03-17 LAB — RAPID URINE DRUG SCREEN, HOSP PERFORMED
Amphetamines: NOT DETECTED
Barbiturates: NOT DETECTED
Benzodiazepines: NOT DETECTED
Cocaine: NOT DETECTED
Opiates: NOT DETECTED
Tetrahydrocannabinol: NOT DETECTED

## 2021-03-17 LAB — ETHANOL: Alcohol, Ethyl (B): 122 mg/dL — ABNORMAL HIGH

## 2021-03-17 LAB — ACETAMINOPHEN LEVEL: Acetaminophen (Tylenol), Serum: <10 ug/mL — ABNORMAL LOW (ref 10–30)
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
## 2021-03-17 NOTE — Discharge Instructions (Signed)
Transfer to ED
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
## 2021-03-17 NOTE — ED Notes (Signed)
Changed into burgundy scrubs.belongings in triage nurse's station.

## 2021-03-17 NOTE — ED Notes (Signed)
Report called to [facility] at [employer].  Pending [employer] transfer to facility.

## 2021-03-18 ENCOUNTER — Encounter (HOSPITAL_COMMUNITY): Payer: Self-pay

## 2021-03-18 DIAGNOSIS — Z87898 Personal history of other specified conditions: Secondary | ICD-10-CM

## 2021-03-18 DIAGNOSIS — F4310 Post-traumatic stress disorder, unspecified: Secondary | ICD-10-CM

## 2021-03-18 LAB — RESP PANEL BY RT-PCR (FLU A&B, COVID) ARPGX2
Influenza A by PCR: NEGATIVE
Influenza B by PCR: NEGATIVE
SARS Coronavirus 2 by RT PCR: NEGATIVE

## 2021-03-18 MED ORDER — THIAMINE HCL 100 MG/ML IJ SOLN: 100.0000 mg | Freq: Once | INTRAMUSCULAR | Status: DC

## 2021-03-18 MED ORDER — ADULT MULTIVITAMIN W/MINERALS CH
1.0000 | ORAL_TABLET | Freq: Every day | ORAL | Status: DC
  Filled 2021-03-18 (×2): qty 1

## 2021-03-18 MED ORDER — FLUOXETINE HCL 20 MG PO CAPS
20.0000 mg | ORAL_CAPSULE | Freq: Every day | ORAL | Status: DC
  Filled 2021-03-18 (×2): qty 1

## 2021-03-18 MED ORDER — HYDROXYZINE HCL 25 MG PO TABS: 25.0000 mg | ORAL_TABLET | Freq: Four times a day (QID) | ORAL | Status: AC | PRN

## 2021-03-18 MED ORDER — ONDANSETRON 4 MG PO TBDP: 4.0000 mg | ORAL_TABLET | Freq: Four times a day (QID) | ORAL | Status: AC | PRN

## 2021-03-18 MED ORDER — LOPERAMIDE HCL 2 MG PO CAPS: 2.0000 mg | ORAL_CAPSULE | ORAL | Status: AC | PRN

## 2021-03-18 MED ORDER — LORAZEPAM 1 MG PO TABS: 1.0000 mg | ORAL_TABLET | Freq: Four times a day (QID) | ORAL | Status: AC | PRN

## 2021-03-18 MED ORDER — THIAMINE HCL 100 MG PO TABS
100.0000 mg | ORAL_TABLET | Freq: Every day | ORAL | Status: DC
  Filled 2021-03-18 (×2): qty 1

## 2021-03-18 MED ORDER — TRAZODONE HCL 50 MG PO TABS: 150.0000 mg | ORAL_TABLET | Freq: Every evening | ORAL | Status: DC | PRN

## 2021-03-18 NOTE — [facility] Counselor (Signed)
Writer contacted Scharlene Corn with patient flow team @ [phone], ext. [phone].  Per Scharlene Corn, the unit is waiting on a discharge before they can make a decision to bring the patient.   Writer was instructed to follow up in the morning for a possible bed placement.

## 2021-03-18 NOTE — ED Notes (Signed)
Pt refusing dinner tray at this time. Pt given coffee and is resting watching tv. NADN. Calm and cooperative.

## 2021-03-18 NOTE — ED Notes (Signed)
ED transfer paperwork faxed to [facility] with [facility] Fax number [phone], toms direct phone number [phone]

## 2021-03-18 NOTE — ED Notes (Signed)
BH Assessment is in progress at this time.

## 2021-03-18 NOTE — BH Assessment (Addendum)
Comprehensive Clinical Assessment (CCA) Note  03/18/2021 Alan Edwards [id number] Disposition: Clinician discussed patient care with Liborio NixonPatrice White, NP.  She recommended [facility] and be seen by psychiatry.  Patient would not be able to go back to [facility] due to age requirements there.  Clinician did inform Lubertha BasqueKimberly Isley, RN via secure message. Flowsheet Row ED [facility]  C-SSRS RISK CATEGORY High Risk       Pt was seen by Nira ConnJason Berry, NP at [facility] earlier.  "Alan SeedsVirgil Eugene Pam is a 80 y.o. male who [facility] with law enforcement due to SI. Law enforcement reports that the patient contacted 911 and reported SI after an argument with his girlfriend. On exam, patient is alert and oriented x 4. He is pleasant. He is very tearful. He requests that TTS counselor leave the room because "no woman should see a man like this." Patient states that he returned home from cutting 9 yards today and his girlfriend was angry with him. Patient reports suicidal thoughts. When asked about a plan, he states "I can't keep gong on like this." He continues to avoid answering questions about SI. When asked about HI, he states "that is why I am here, because I don't want to hurt anyone." He states that he drinks alcohol but will not quantify how much he drinks. He states "just normal amounts. Im not an alcoholic, but people might think I am." He states that he deer drink beer today when cutting grass. He denies auditory and visual hallucinations. He does not appear to be responding to internal stimuli."  Pt said that he had gotten angry and had called 911 because he did not know what to do. [facility] [employer] [facility]. they are unable to serve him due to age criteria so he was [facility]. Patient eplains that he had gotten upset with his girlfriend. She had been upset with him. Pt says he  feels fine now. He did not know what to do or where to turn. Patient denies any HI. Denies any A/V halluciantions. Pt admits to drinking earlier in the day yesterday. BAL was 122 at 20:43.  Patient said that his girlfriend ( with whom he lives) had gotten angry with him and had "blown a fuse." He did not know what to do and called 911. Deputies came to her home and brought [facility]. While there he had made some SI statements. Now he says he "got angry and did not know what to do." patient He denies any current SI. He had one attempt many years ago "when I was younger." Patient denies any HI or A/V hallucinaations. He says he does drink about 3-4 beers every night. He says that he has no withdrawal symptoms if he does not drink. Pt had a BAL of 122 at 20:43. Patient says girlfriend did say she was mad because he was drinking. [facility] who documented patient had SI but did not have a plan. Pt is deemed to be at high risk of self harm although at this time he denies this.  Clinician did get permission to contact pt's girlfriend, Roselee CulverFay Pegram [phone].  Clinician did try to get in touch with her but there was no answer.  Patient is oriented x4 and has good eye contact.  He appears to be anxious and cannot say  why he feels the way he does.  Pt does not appear to be responding to internal stimuli.  Patient does not evidence any delusional thinking.  He does take a prescription for sleeping.  He reports appetite WNL.  Pt does not have any current outpatient mental health care outside of the [place] in [place].  Last inpatient mental health there was 2 years ago.   Chief Complaint:  Chief Complaint  Patient presents with  . Mental Health Problem  . Suicidal   Visit Diagnosis: MDD recurrent moderate; ETOH use d/o severe    CCA Screening, Triage and Referral (STR)  Patient Reported Information How did you hear about [place]? Self (Pt had called 911 last night because he was angry  and did not know what to do.)  Referral name: No data recorded Referral phone number: No data recorded  Whom do you see for routine medical problems? Primary Care  Practice/Facility Name: [place] in [facility]  Practice/Facility Phone Number: No data recorded Name of Contact: No data recorded Contact Number: No data recorded Contact Fax Number: No data recorded Prescriber Name: No data recorded Prescriber Address (if known): No data recorded  What Is the Reason for Your Visit/Call Today? Pt said that he had gotten angry and had called 911 because he did not know what to do.  He had initially gone to [facility] with [employer].  they are unable to serve him due to age criteria so he was transported to [facility].  Patient eplains that he had gotten upset with his girlfriend.  She had been upset with him.  Pt says he feels fine now.  He did not know what to do or where to turn.  Patient denies any HI.  Denies any A/V halluciantions.  Pt admits to drinking earlier in the day yesterday.  BAL was 122 at 20:43.  How Long Has This Been Causing You Problems? <Week  What Do You Feel Would Help You the Most Today? Treatment for Depression or other mood problem   Have You Recently Been in Any Inpatient Treatment (Hospital/Detox/Crisis Center/28-Day Program)? No  Name/Location of Program/Hospital:No data recorded How Long Were You There? No data recorded When Were You Discharged? No data recorded  Have You Ever Received Services From [facility] Before? Yes  Who Do You See at [facility]? Broke his leg in February of 2021.   Have You Recently Had Any Thoughts About Hurting Yourself? Yes  Are You Planning to Commit Suicide/Harm Yourself At This time? No   Have you Recently Had Thoughts About Hurting Someone Karolee Ohs? No  Explanation: No data recorded  Have You Used Any Alcohol or Drugs in the Past 24 Hours? Yes  How Long Ago Did You Use Drugs or Alcohol? No data recorded What Did You Use and How  Much? Pt says he drank 3 mixed drinks yesterday.   Do You Currently Have a Therapist/Psychiatrist? No  Name of Therapist/Psychiatrist: No data recorded  Have You Been Recently Discharged From Any Office Practice or Programs? No  Explanation of Discharge From Practice/Program: No data recorded    CCA Screening Triage Referral Assessment Type of Contact: Tele-Assessment  Is this Initial or Reassessment? Initial Assessment  Date Telepsych consult ordered in [facility]:  03/17/2021  Time Telepsych consult ordered in [facility]:  2354   Patient Reported Information Reviewed? Yes  Patient Left Without Being Seen? No data recorded Reason for Not Completing Assessment: No data recorded  Collateral Involvement: Pt said it was okay to  call Roselee Culver [phone]   Does Patient Have a Court Appointed Legal Guardian? No data recorded Name and Contact of Legal Guardian: No data recorded If Minor and Not Living with Parent(s), Who has Custody? No data recorded Is CPS involved or ever been involved? No data recorded Is APS involved or ever been involved? Never   Patient Determined To Be At Risk for Harm To Self or Others Based on Review of Patient Reported Information or Presenting Complaint? Yes, for Self-Harm (Earlier patient had had some SI.)  Method: No data recorded Availability of Means: No data recorded Intent: No data recorded Notification Required: No data recorded Additional Information for Danger to Others Potential: No data recorded Additional Comments for Danger to Others Potential: No data recorded Are There Guns or Other Weapons in Your Home? No data recorded Types of Guns/Weapons: No data recorded Are These Weapons Safely Secured?                            No data recorded Who Could Verify You Are Able To Have These Secured: No data recorded Do You Have any Outstanding Charges, Pending Court Dates, Parole/Probation? No data recorded Contacted To Inform of Risk of Harm To Self  or Others: No data recorded  Location of Assessment: [facility] ED   Does Patient Present under Involuntary Commitment? No  IVC Papers Initial File Date: No data recorded  [place] of Residence: [place]   Patient Currently Receiving the Following Services: Not Receiving Services   Determination of Need: Urgent (48 hours)   Options For Referral: Other: Comment (Observation overnight and reassess in AM.)     CCA Biopsychosocial Intake/Chief Complaint:  Patient said that his girlfriend ( with whom he lives) had gotten angry with him and had "blown a fuse."  He did not know what to do and called 911.  Deputies came to her home and [facility].  While there he had made some SI statements.  Now he says he "got angry and did not know what to do."  patient He denies any current SI.  He had one attempt many years ago "when I was younger."  Patient denies any HI or A/V hallucinaations.  He says he does drink about 3-4 beers every night.  He says that he has no withdrawal symptoms if he does not drink.  Pt had a BAL of 122 at 20:43.  Patient says girlfriend did say she was mad because he was drinking.  Pt was seen by NP at [facility] who documented patient had SI but did not have a plan.  Pt is deemed to be at high risk of self harm although at this time he denies this.  Current Symptoms/Problems: Made SI statements earlier.   Patient Reported Schizophrenia/Schizoaffective Diagnosis in Past: No   Strengths: No data recorded Preferences: No data recorded Abilities: No data recorded  Type of Services Patient Feels are Needed: No data recorded  Initial Clinical Notes/Concerns: No data recorded  Mental Health Symptoms Depression:  Difficulty Concentrating; Sleep (too much or little)   Duration of Depressive symptoms: No data recorded  Mania:  No data recorded  Anxiety:   Difficulty concentrating; Tension; Worrying   Psychosis:  None   Duration of Psychotic symptoms: No data recorded   Trauma:  None   Obsessions:  None   Compulsions:  None   Inattention:  None   Hyperactivity/Impulsivity:  N/A   Oppositional/Defiant  Behaviors:  None   Emotional Irregularity:  Chronic feelings of emptiness   Other Mood/Personality Symptoms:  No data recorded   Mental Status Exam Appearance and self-care  Stature:  Average   Weight:  Average weight   Clothing:  No data recorded  Grooming:  Normal   Cosmetic use:  None   Posture/gait:  Normal   Motor activity:  Not Remarkable   Sensorium  Attention:  Normal   Concentration:  Preoccupied   Orientation:  X5   Recall/memory:  Normal   Affect and Mood  Affect:  Anxious   Mood:  Anxious   Relating  Eye contact:  Normal   Facial expression:  Anxious   Attitude toward examiner:  Cooperative   Thought and Language  Speech flow: Clear and Coherent   Thought content:  Appropriate to Mood and Circumstances   Preoccupation:  None   Hallucinations:  None   Organization:  No data recorded  [employer] of Knowledge:  Average   Intelligence:  Average   Abstraction:  Normal   Judgement:  Fair   Reality Testing:  Unaware   Insight:  Good   Decision Making:  Normal   Social Functioning  Social Maturity:  Responsible   Social Judgement:  Normal   Stress  Stressors:  Relationship   Coping Ability:  Human resources officer Deficits:  Decision making   Supports:  Friends/Service system     Religion:    Leisure/Recreation:    Exercise/Diet: Exercise/Diet Do You Have Any Trouble Sleeping?: Yes Explanation of Sleeping Difficulties: Has prescription medication.   CCA Employment/Education Employment/Work Situation: Employment / Work Situation Employment situation: Retired Has patient ever been in the [employer]?: Yes (Describe in comment) (Pt was in the [employer])  Education: Education Is Patient Currently Attending School?: No Did Garment/textile technologist From [employer]?: No Did Engineer, water?: No Did Designer, television/film set?: No Did You Have An Individualized Education Program (IIEP): No Did You Have Any Difficulty At [employer]?: No Patient's Education Has Been Impacted by Current Illness: No   CCA Family/Childhood History Family and Relationship History: Family history Marital status: Long term relationship Long term relationship, how long?: 40 years Does patient have children?: Yes How many children?: 1 How is patient's relationship with their children?: Does not interact with him much.  This 79 year old son lives in [place].  Childhood History:  Childhood History By whom was/is the patient raised?: Other (Comment) Additional childhood history information: Pt was in an orphanage. Does patient have siblings?: Yes Number of Siblings: 1 Did patient suffer any verbal/emotional/physical/sexual abuse as a child?: No Did patient suffer from severe childhood neglect?: No Has patient ever been sexually abused/assaulted/raped as an adolescent or adult?: No Was the patient ever a victim of a crime or a disaster?: No Witnessed domestic violence?: No Has patient been affected by domestic violence as an adult?: No  Child/Adolescent Assessment:     CCA Substance Use Alcohol/Drug Use: Alcohol / Drug Use Pain Medications: None Prescriptions: Prescribed sleeping pills. Over the Counter: None History of alcohol / drug use?: Yes Substance #1 Name of Substance 1: ETOH (beer) 1 - Age of First Use: Can't recall 1 - Amount (size/oz): 3-4 beers at night 1 - Frequency: Nightly 1 - Duration: ongoing 1 - Last Use / Amount: 04/20 usual amount 1 - Method of Aquiring: purchase 1- Route of Use: oral  ASAM's:  Six Dimensions of Multidimensional Assessment  Dimension 1:  Acute Intoxication and/or Withdrawal Potential:   Dimension 1:  Description of individual's past and current experiences of substance use and withdrawal: Pt not worried  about getting off of ETOH.  Dimension 2:  Biomedical Conditions and Complications:   Dimension 2:  Description of patient's biomedical conditions and  complications: Pt not worried about getting off of ETOH  Dimension 3:  Emotional, Behavioral, or Cognitive Conditions and Complications:  Dimension 3:  Description of emotional, behavioral, or cognitive conditions and complications: Pt not worried about getting off of ETOH  Dimension 4:  Readiness to Change:  Dimension 4:  Description of Readiness to Change criteria: Pt not worried about getting off of ETOH  Dimension 5:  Relapse, Continued use, or Continued Problem Potential:  Dimension 5:  Relapse, continued use, or continued problem potential critiera description: Pt not worried about getting off of ETOH  Dimension 6:  Recovery/Living Environment:  Dimension 6:  Recovery/Iiving environment criteria description: Pt not worried about getting off of ETOH  ASAM Severity Score: ASAM's Severity Rating Score: 1  ASAM Recommended Level of Treatment: ASAM Recommended Level of Treatment: Level I Outpatient Treatment   Substance use Disorder (SUD) Substance Use Disorder (SUD)  Checklist Symptoms of Substance Use: Evidence of tolerance,Presence of craving or strong urge to use  Recommendations for Services/Supports/Treatments: Recommendations for Services/Supports/Treatments Recommendations For Services/Supports/Treatments: Other (Comment) (Observation at [facility] and psychiatric reassessment later in morning.)  DSM5 Diagnoses: Patient Active Problem List   Diagnosis Date Noted  . S/P ORIF (open reduction internal fixation) fracture 01/28/2020  . Fracture of right ankle, lateral malleolus 01/14/2020    Patient Centered Plan: Patient is on the following Treatment Plan(s):  Anxiety, Depression and Substance Abuse   Referrals to Alternative Service(s): Referred to Alternative Service(s):   Place:   Date:   Time:    Referred to Alternative Service(s):    Place:   Date:   Time:    Referred to Alternative Service(s):   Place:   Date:   Time:    Referred to Alternative Service(s):   Place:   Date:   Time:     Wandra Mannan

## 2021-03-18 NOTE — ED Notes (Signed)
PT states he does not want to take medications. States hes never been a pill taker and does not want to take any now.

## 2021-03-18 NOTE — Consult Note (Signed)
Telepsych Consultation   Reason for Consult: Psychiatry provider reassessment Referring Physician:   Location of Patient: [facility] Location of Provider: Behavioral Health TTS Department  Patient Identification: Alan Edwards MRN:  [id number] Principal Diagnosis: History of alcohol use disorder Diagnosis:  Principal Problem:   History of alcohol use disorder Active Problems:   PTSD (post-traumatic stress disorder)   Total Time spent with patient: 30 minutes  Subjective:   Alan Edwards is a 80 y.o. male patient.  Patient states "she (girlfriend-Alan Edwards) thinks I have a drinking problem, but I do not know."  HPI:   Patient assessed by nurse practitioner.  He is alert and oriented, answers appropriately.  Mood appears depressed, he is tearful throughout assessment.  He denies suicidal and homicidal ideations currently, endorses 1 prior suicide attempt approximately 40 years ago when he cut his wrists.  He denies auditory visual hallucinations, there is no evidence of delusional thought content.  He denies symptoms of paranoia.  Alan Edwards reports his girlfriend became frustrated with him on yesterday related to chronic alcohol use.  Initially patient reports he does not believe he has alcohol use disorder, he then goes on to state "but I do not know, I did not drink, I drink at home."  He does not elaborate on amount or frequency of alcohol abuse.  He appears to lack insight regarding alcohol use disorder as well as mental health history.  Alan Edwards reports he has been diagnosed with mental illness in the past, unable to recall diagnosis at this time.  He reports he was released after an inpatient psychiatric stay at the [facility] approximately 1 year ago, he did not continue medications upon discharge.  He states that "I do not take drugs."  He has not followed up with outpatient psychiatry during the past 1 year.  He has not taken any medications to address  his mood during the past 1 year.  Per medical record he has a history of PTSD, depressive disorder and alcohol use disorder.  Per medical record patient has been prescribed trazodone 150 mg nightly as needed/sleep and Prozac 20 mg daily to address mood.  He resides in [place] with his girlfriend x4 years.  He denies access to weapons.  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  He denies substance use.  He endorses adequate sleep and appetite.  Patient offered support and encouragement.  He gives verbal consent to speak with girlfriend, Alan Edwards phone number [phone].   Past Psychiatric History: PTSD, depressive disorder, alcohol use disorder  Risk to Self:   denies Risk to Others:   denies [facility] Prior Outpatient Therapy:   none reported  Past Medical History:  Past Medical History:  Diagnosis Date  . Medical history non-contributory     Past Surgical History:  Procedure Laterality Date  . COLONOSCOPY    . ORIF ANKLE FRACTURE Right 01/14/2020   Procedure: OPEN REDUCTION INTERNAL FIXATION (ORIF) RIGHT ANKLE FRACTURE;  Surgeon: Kathryne Hitch, MD;  Location: [facility] OR;  Service: Orthopedics;  Laterality: Right;   Family History: History reviewed. No pertinent family history. Family Psychiatric  History: None reported Social History:  Social History   Substance and Sexual Activity  Alcohol Use Yes  . Alcohol/week: 21.0 standard drinks  . Types: 21 Cans of beer per week     Social History   Substance and Sexual Activity  Drug Use No  Social History   Socioeconomic History  . Marital status: Single    Spouse name: Not on file  . Number of children: Not on file  . Years of education: Not on file  . Highest education level: Not on file  Occupational History  . Not on file  Tobacco Use  . Smoking status: Current Some Day Smoker    Types: Cigars  . Smokeless tobacco: Never Used  Vaping Use  .  Vaping Use: Never used  Substance and Sexual Activity  . Alcohol use: Yes    Alcohol/week: 21.0 standard drinks    Types: 21 Cans of beer per week  . Drug use: No  . Sexual activity: Not on file  Other Topics Concern  . Not on file  Social History Narrative  . Not on file   Social Determinants of Health   Financial Resource Strain: Not on file  Food Insecurity: Not on file  Transportation Needs: Not on file  Physical Activity: Not on file  Stress: Not on file  Social Connections: Not on file   Additional Social History:    Allergies:  No Known Allergies  Labs:  Results for orders placed or performed during the hospital encounter of 03/17/21 (from the past 48 hour(s))  Resp Panel by RT-PCR (Flu A&B, Covid) Nasopharyngeal Swab     Status: None   Collection Time: 03/17/21  3:26 AM   Specimen: Nasopharyngeal Swab; Nasopharyngeal(NP) swabs in vial transport medium  Result Value Ref Range   SARS Coronavirus 2 by RT PCR NEGATIVE NEGATIVE    Comment: (NOTE) SARS-CoV-2 target nucleic acids are NOT DETECTED.  The SARS-CoV-2 RNA is generally detectable in upper respiratory specimens during the acute phase of infection. The lowest concentration of SARS-CoV-2 viral copies this assay can detect is 138 copies/mL. A negative result does not preclude SARS-Cov-2 infection and should not be used as the sole basis for treatment or other patient management decisions. A negative result may occur with  improper specimen collection/handling, submission of specimen other than nasopharyngeal swab, presence of viral mutation(s) within the areas targeted by this assay, and inadequate number of viral copies(<138 copies/mL). A negative result must be combined with clinical observations, patient history, and epidemiological information. The expected result is Negative.  Fact Sheet for Patients:  BloggerCourse.com  Fact Sheet for Healthcare Providers:   SeriousBroker.it  This test is no t yet approved or cleared by the [place] FDA and  has been authorized for detection and/or diagnosis of SARS-CoV-2 by FDA under an Emergency Use Authorization (EUA). This EUA will remain  in effect (meaning this test can be used) for the duration of the COVID-19 declaration under Section 564(b)(1) of the Act, 21 U.S.C.section 360bbb-3(b)(1), unless the authorization is terminated  or revoked sooner.       Influenza A by PCR NEGATIVE NEGATIVE   Influenza B by PCR NEGATIVE NEGATIVE    Comment: (NOTE) The Xpert Xpress SARS-CoV-2/FLU/RSV plus assay is intended as an aid in the diagnosis of influenza from Nasopharyngeal swab specimens and should not be used as a sole basis for treatment. Nasal washings and aspirates are unacceptable for Xpert Xpress SARS-CoV-2/FLU/RSV testing.  Fact Sheet for Patients: BloggerCourse.com  Fact Sheet for Healthcare Providers: SeriousBroker.it  This test is not yet approved or cleared by the [place] FDA and has been authorized for detection and/or diagnosis of SARS-CoV-2 by FDA under an Emergency Use Authorization (EUA). This EUA will remain in effect (meaning this test can be used) for  the duration of the COVID-19 declaration under Section 564(b)(1) of the Act, 21 U.S.C. section 360bbb-3(b)(1), unless the authorization is terminated or revoked.  Performed at [facility], [address] [place], [place] [address]   Urine rapid drug screen (hosp performed)     Status: None   Collection Time: 03/17/21  8:32 PM  Result Value Ref Range   Opiates NONE DETECTED NONE DETECTED   Cocaine NONE DETECTED NONE DETECTED   Benzodiazepines NONE DETECTED NONE DETECTED   Amphetamines NONE DETECTED NONE DETECTED   Tetrahydrocannabinol NONE DETECTED NONE DETECTED   Barbiturates NONE DETECTED NONE DETECTED    Comment: (NOTE) DRUG SCREEN  FOR MEDICAL PURPOSES ONLY.  IF CONFIRMATION IS NEEDED FOR ANY PURPOSE, NOTIFY LAB WITHIN 5 DAYS.  LOWEST DETECTABLE LIMITS FOR URINE DRUG SCREEN Drug Class                     Cutoff (ng/mL) Amphetamine and metabolites    1000 Barbiturate and metabolites    200 Benzodiazepine                 200 Tricyclics and metabolites     300 Opiates and metabolites        300 Cocaine and metabolites        300 THC                            50 Performed at [facility], [address] [place], [place] [address]   Urinalysis, Routine w reflex microscopic Urine, Clean Catch     Status: None   Collection Time: 03/17/21  8:32 PM  Result Value Ref Range   Color, Urine YELLOW YELLOW   APPearance CLEAR CLEAR   Specific Gravity, Urine 1.014 1.005 - 1.030   pH 5.0 5.0 - 8.0   Glucose, UA NEGATIVE NEGATIVE mg/dL   Hgb urine dipstick NEGATIVE NEGATIVE   Bilirubin Urine NEGATIVE NEGATIVE   Ketones, ur NEGATIVE NEGATIVE mg/dL   Protein, ur NEGATIVE NEGATIVE mg/dL   Nitrite NEGATIVE NEGATIVE   Leukocytes,Ua NEGATIVE NEGATIVE    Comment: Performed at [facility], [address], [place], [place] [address]  Comprehensive metabolic panel     Status: Abnormal   Collection Time: 03/17/21  8:43 PM  Result Value Ref Range   Sodium 138 135 - 145 mmol/L   Potassium 4.5 3.5 - 5.1 mmol/L   Chloride 102 98 - 111 mmol/L   CO2 24 22 - 32 mmol/L   Glucose, Bld 107 (H) 70 - 99 mg/dL    Comment: Glucose reference range applies only to samples taken after fasting for at least 8 hours.   BUN 14 8 - 23 mg/dL   Creatinine, Ser 8.46 0.61 - 1.24 mg/dL   Calcium 9.8 8.9 - 65.9 mg/dL   Total Protein 7.9 6.5 - 8.1 g/dL   Albumin 4.7 3.5 - 5.0 g/dL   AST 28 15 - 41 U/L   ALT 26 0 - 44 U/L   Alkaline Phosphatase 64 38 - 126 U/L   Total Bilirubin 0.9 0.3 - 1.2 mg/dL   GFR, Estimated >93 >57 mL/min    Comment: (NOTE) Calculated using the CKD-EPI Creatinine Equation (2021)    Anion gap 12 5 - 15     Comment: Performed at [facility], [address], [place], [place] [address]  Ethanol     Status: Abnormal  Collection Time: 03/17/21  8:43 PM  Result Value Ref Range   Alcohol, Ethyl (B) 122 (H) <10 mg/dL    Comment: (NOTE) Lowest detectable limit for serum alcohol is 10 mg/dL.  For medical purposes only. Performed at [facility], [address], [place], [place] [address]   CBC with Diff     Status: Abnormal   Collection Time: 03/17/21  8:43 PM  Result Value Ref Range   WBC 6.0 4.0 - 10.5 K/uL   RBC 4.83 4.22 - 5.81 MIL/uL   Hemoglobin 16.5 13.0 - 17.0 g/dL   HCT 65.4 65.0 - 35.4 %   MCV 99.0 80.0 - 100.0 fL   MCH 34.2 (H) 26.0 - 34.0 pg   MCHC 34.5 30.0 - 36.0 g/dL   RDW 65.6 81.2 - 75.1 %   Platelets 200 150 - 400 K/uL   nRBC 0.0 0.0 - 0.2 %   Neutrophils Relative % 70 %   Neutro Abs 4.2 1.7 - 7.7 K/uL   Lymphocytes Relative 20 %   Lymphs Abs 1.2 0.7 - 4.0 K/uL   Monocytes Relative 8 %   Monocytes Absolute 0.5 0.1 - 1.0 K/uL   Eosinophils Relative 1 %   Eosinophils Absolute 0.1 0.0 - 0.5 K/uL   Basophils Relative 1 %   Basophils Absolute 0.1 0.0 - 0.1 K/uL   Immature Granulocytes 0 %   Abs Immature Granulocytes 0.02 0.00 - 0.07 K/uL    Comment: Performed at [facility], [address] [place], [place] [address]  Salicylate level     Status: Abnormal   Collection Time: 03/17/21  8:43 PM  Result Value Ref Range   Salicylate Lvl <7.0 (L) 7.0 - 30.0 mg/dL    Comment: Performed at [facility], [address], [place], [place] [address]  Acetaminophen level     Status: Abnormal   Collection Time: 03/17/21  8:43 PM  Result Value Ref Range   Acetaminophen (Tylenol), Serum <10 (L) 10 - 30 ug/mL    Comment: (NOTE) Therapeutic concentrations vary significantly. A range of 10-30 ug/mL  may be an effective concentration for many patients. However, some  are best treated at concentrations outside of this range. Acetaminophen concentrations >150  ug/mL at 4 hours after ingestion  and >50 ug/mL at 12 hours after ingestion are often associated with  toxic reactions.  Performed at [facility], [address] [place], [place] [address]     Medications:  No current facility-administered medications for this encounter.   No current outpatient medications on file.    Musculoskeletal: Strength & Muscle Tone: within normal limits Gait & Station: normal Patient leans: N/A  Psychiatric Specialty Exam: Physical Exam Vitals and nursing note reviewed.  Constitutional:      Appearance: Normal appearance. He is well-developed and normal weight.  HENT:     Head: Normocephalic.     Nose: Nose normal.  Cardiovascular:     Rate and Rhythm: Normal rate.  Pulmonary:     Effort: Pulmonary effort is normal.  Musculoskeletal:        General: Normal range of motion.     Cervical back: Normal range of motion.  Neurological:     Mental Status: He is alert and oriented to person, place, and time.  Psychiatric:        Attention and Perception: Attention and perception normal.        Mood and Affect: Mood is depressed. Affect is tearful.  Speech: Speech normal.        Behavior: Behavior normal. Behavior is cooperative.        Thought Content: Thought content normal.        Cognition and Memory: Cognition and memory normal.        Judgment: Judgment normal.     Review of Systems  Constitutional: Negative.   HENT: Negative.   Eyes: Negative.   Respiratory: Negative.   Cardiovascular: Negative.   Gastrointestinal: Negative.   Genitourinary: Negative.   Musculoskeletal: Negative.   Skin: Negative.   Neurological: Negative.   Psychiatric/Behavioral: Negative.     Blood pressure (!) 145/91, pulse 74, temperature 98.5 F (36.9 C), temperature source Oral, resp. rate 15, height 5\' 11"  (1.803 m), weight 90.7 kg, SpO2 98 %.Body mass index is 27.89 kg/m.  General Appearance: Casual and Fairly Groomed  Eye Contact:  Fair   Speech:  Clear and Coherent and Normal Rate  Volume:  Normal  Mood:  Depressed  Affect:  Tearful  Thought Process:  Coherent, Goal Directed and Descriptions of Associations: Intact  Orientation:  Full (Time, Place, and Person)  Thought Content:  Logical  Suicidal Thoughts:  No  Homicidal Thoughts:  No  Memory:  Immediate;   Fair Recent;   Fair Remote;   Fair  Judgement:  Fair  Insight:  Lacking  Psychomotor Activity:  Normal  Concentration:  Concentration: Fair and Attention Span: Fair  Recall:  FiservFair  Fund of Knowledge:  Fair  Language:  Fair  Akathisia:  No  Handed:  Right  AIMS (if indicated):     Assets:  Communication Skills Desire for Improvement Financial Resources/Insurance Housing Intimacy Leisure Time Physical Health Resilience Social Support Talents/Skills  ADL's:  Intact  Cognition:  WNL  Sleep:        Treatment Plan Summary: Plan Inpatient psychiatric treatment recommended.  Patient reviewed with Dr. Bronwen BettersLaubach. Medications: - Prozac 20 mg daily - Trazodone 150 mg nightly as needed/sleep -Ativan CIWA detox protocol  Disposition: Recommend psychiatric Inpatient admission when medically cleared. Supportive therapy provided about ongoing stressors.  This service was provided via telemedicine using a 2-way, interactive audio and video technology.  Names of all persons participating in this telemedicine service and their role in this encounter. Name: Ellwood SayersVirgil Knoll Role: Patient  Name: Doran Heaterina Chelbie Jarnagin Role: FNP  Name: Dr. Bronwen BettersLaubach Role: Psychiatry    Lenard Lanceina L Joseluis Alessio, FNP 03/18/2021 10:55 AM

## 2021-03-18 NOTE — Progress Notes (Addendum)
[facility]. CSW sent referral to [facility] inpatient unit. [facility] so long as [facility]. Patient being reviewed, admissions requested disposition social worker call back after 1800 for decision. Patient flow team @ [phone], ext. [phone]  No [facility] inpatient BH beds available at:  [facility] [place]  CSW will continue to monitor disposition.   Signed:  Corky Crafts, MSW, [place], LCASA 03/18/2021 12:55 PM

## 2021-03-18 NOTE — ED Notes (Signed)
Pt belongings locked in locker # 2.

## 2021-03-18 NOTE — ED Notes (Signed)
Report given to [facility] in [place]. Moving pt now.

## 2021-03-18 NOTE — ED Notes (Signed)
PT trying to sleep, quiet and cooperative.

## 2021-03-18 NOTE — ED Notes (Addendum)
Pt stated that his wife got upset with him. He called the police and had them bring him in.

## 2021-03-19 NOTE — ED Notes (Addendum)
Pt came out of his room and stated he did not want his breakfast. Pt then throw his food in the trash. This tech ask this pt if he would like something else to eat. Pt stated to this tech no, he would just like coffee. Pt provided coffee. Nurse notified.

## 2021-03-19 NOTE — Consult Note (Addendum)
Spoke with patient's girlfriend, [name], with patient's consent.  Carie Caddy reports concern for patient's alcohol use. [name] describes incident that led to emergency department two days ago. [name] reports "he drinks all together too much, he was drunk when I came home and I have never seen him so drunk, he could hardly walk." [name] reports while cleaning she accidentally broke a glass and then Mr. Alomar threw the broken glass at the Faye's feet. [name] states "he is a kind and nice guy but he turned into a monster slamming things around and saying he would kill himself." She reports she has encouraged him to seek help for his alcohol use for some time. Carie Caddy (girlfriend) would like Mr Longton to know that he can return to her home once feeling better. She would like for Mr Ehrman to call her please home- preferred [phone] alternate cell [phone] when he can please, she would also like to visit if possible?  With Mr. Kassebaum permission [name] would like to be updated regarding treatment plan.

## 2021-03-19 NOTE — ED Notes (Signed)
Pt refuses to eat at this time. Pt reports he is not hungry. Pt has 2 cups of coffee at bedside.

## 2021-03-19 NOTE — ED Provider Notes (Addendum)
Emergency Medicine Observation Re-evaluation Note  Alan Edwards is a 80 y.o. male, seen on rounds today.  Pt initially presented to the ED for complaints of Mental Health Problem and Suicidal Currently, the patient is resting comfortably, cooperative and talking.  He is tearful on exam.  Physical Exam  BP (!) 146/84 (BP Location: Left Arm)   Pulse 66   Temp 98.1 F (36.7 C) (Oral)   Resp 18   Ht 5\' 11"  (1.803 m)   Wt 90.7 kg   SpO2 97%   BMI 27.89 kg/m  Physical Exam General: Nontoxic appearance.  He is not tremulous or confused Cardiac: Normal heart Lungs: Normal respiratory rate Psych: Calm  ED Course / MDM  EKG:EKG Interpretation  Date/Time:  Thursday March 18 2021 03:01:55 EDT Ventricular Rate:  87 PR Interval:  172 QRS Duration: 100 QT Interval:  394 QTC Calculation: 474 R Axis:   66 Text Interpretation: Normal sinus rhythm Normal ECG When compared with ECG of 06/11/2015, No significant change was found Confirmed by 06/13/2015 (Dione Booze) on 03/19/2021 1:08:20 AM   I have reviewed the labs performed to date as well as medications administered while in observation.  Recent changes in the last 24 hours include he has been medically cleared and stable.  Blood pressure is mildly elevated.  He has refused all medications.  He told the nurse that "I do not like to take medicines."  He is cooperative and agreeable to transfer for psychiatric care.  I had a conversation with a psychiatrist at the [facility] in [place], [place].  He states that the patient is out of his region but he would consider taking the patient if he was stable and could be transferred there.  I referred him to social work to discuss the possibility of his being transferred to [facility], for treatment.  Plan  Current plan is for psychiatric admission. Patient is not under full IVC at this time.       [facility], MD 03/19/21 1242

## 2021-03-19 NOTE — ED Notes (Signed)
Pt requested coffee.. Pt given coffee at this time

## 2021-03-19 NOTE — ED Notes (Signed)
Provided pt some coffee, pt does not want any snacks at this time

## 2021-03-19 NOTE — ED Notes (Signed)
Breakfast tray ordered for patient.

## 2021-03-19 NOTE — Progress Notes (Signed)
Patient meets inpatient criteria per Doran Heater, NP.    Patient was referred to the following facilities:   Service Provider Address Phone Fax  [facility]  [address], [place] [address] [phone]  [facility] [address]  [address], [place] [address] [phone]  [facility]  [address] [place] [address] [phone]  [facility]  [address] [place], [place] [address] [phone]  [facility]  [address], [place] [address] [phone]  [facility]  [address], [place] [address] [phone]  [facility]  [address] [place] [address] [phone]  [facility]  [address] [place] [address] [phone]  [facility]  [address], [place] [address] [phone]  [facility]  [address] [place] [address] [phone]  [facility]  [address] [place], [place] [address] [phone]  [facility]  [address], [place] [address] [phone]  [facility]  [address], [place] [address] [phone]  [facility]  [address] [place], [place] [address] [phone]  [facility]  [address] [place] [address] [phone]  [facility]  [address] [place] [address] [phone]     CSW will continue to monitor for disposition.  Penni Homans, MSW, LCSW 03/19/2021 3:04 PM

## 2021-03-20 NOTE — ED Notes (Signed)
Per Leanora Cover, RN, pt has been accepted to [facility] unit B. Accepting provider is Dr. Otho Perl. Patient can arrive anytime after 4:00PM today. Number for report is [phone].  Per Company secretary

## 2021-03-20 NOTE — ED Notes (Addendum)
Report called to [facility]  RN name accepting pt is Retail banker

## 2021-03-20 NOTE — ED Notes (Addendum)
Pt received breakfast tray,  pt awake, eating and watching tv. Pt given two cups decaf. Coffee.

## 2021-03-20 NOTE — Progress Notes (Signed)
CSW followed-up with [place], [place], and [employer] officers who reports that there are no available beds today. It should be noted that [facility] and [facility] [place] are currently on diversion.     Crissie Reese, MSW, LCSW-A, LCAS-A Phone: [phone] Disposition/TOC

## 2021-03-20 NOTE — ED Notes (Signed)
[facility] transport was called and stated they are unable to take my patient . Spoke with Asher Muir CN and she informed me to page Gilda Crease via secure chat.

## 2021-03-20 NOTE — ED Notes (Signed)
Pt up to restroom. Provided tooth brush and tooth paste to pt.

## 2021-03-20 NOTE — ED Provider Notes (Signed)
Emergency Medicine Observation Re-evaluation Note  Rich Paprocki is a 80 y.o. male, seen on rounds today.  Pt initially presented to the ED for complaints of Mental Health Problem and Suicidal Currently, the patient is awaiting psychiatric placement  Physical Exam  BP (!) 136/91 (BP Location: Left Arm)   Pulse 82   Temp 98.2 F (36.8 C) (Oral)   Resp 18   Ht 5\' 11"  (1.803 m)   Wt 90.7 kg   SpO2 95%   BMI 27.89 kg/m  Physical Exam General: NAD Cardiac: RRR Lungs: no respiratory distress Psych: calm, depressed mood  ED Course / MDM  EKG:EKG Interpretation  Date/Time:  Thursday March 18 2021 03:01:55 EDT Ventricular Rate:  87 PR Interval:  172 QRS Duration: 100 QT Interval:  394 QTC Calculation: 474 R Axis:   66 Text Interpretation: Normal sinus rhythm Normal ECG When compared with ECG of 06/11/2015, No significant change was found Confirmed by 06/13/2015 (Dione Booze) on 03/19/2021 1:08:20 AM   I have reviewed the labs performed to date as well as medications administered while in observation.  Recent changes in the last 24 hours include - none .  Plan  Current plan is for [facility] today for ongoing Psychiatric care. Patient is not under full IVC at this time.   Onnie Graham, MD 03/20/21 1328

## 2021-03-20 NOTE — ED Notes (Signed)
Dr. Rees at bedside  

## 2021-03-20 NOTE — Progress Notes (Signed)
Per Leanora Cover, RN, pt has been accepted to [facility] unit B. Accepting provider is Dr. Otho Perl. Patient can arrive anytime after 4:00PM today. Number for report is [phone].  Crissie Reese, MSW, LCSW-A Phone: [phone] Disposition/TOC

## 2021-03-20 NOTE — ED Notes (Signed)
Pt sleeping. 

## 2021-03-20 NOTE — ED Notes (Signed)
Pt ate breakfast tray.

## 2021-03-20 NOTE — ED Notes (Signed)
Pt received lunch tray 

## 2021-03-20 NOTE — Progress Notes (Signed)
CSW contacted by [facility] as patient accepted to [facility] after 4p for voluntary admissions however [facility] stops transportation at 3pm. CSW started a group chat with team to discuss ideas and noted per RN patient is ambulatory and would not be appropriate for [facility] transportation. CSW notes likely delay until tomorrow AM. CSW reached out to [facility] admissions and spoke with Fayrene Fearing and confirmed that since report was received by [facility] then the bed would be held and patient would be able to be transferred tomorrow AM. CSW notes current plant for team to reach out first thing tomorrow to [facility] to get transportation going.

## 2021-03-20 NOTE — ED Notes (Signed)
Pt given cup of decaf. Coffee.

## 2021-03-20 NOTE — Progress Notes (Signed)
Per Ardell Isaacs, patient meets criteria for inpatient treatment. There are no available or appropriate beds at [facility] today. CSW faxed referrals to the following facilities for review:  [facility]  [facility]  TTS will continue to seek bed placement.  Crissie Reese, MSW, LCSW-A, LCAS-A Phone: [phone] Disposition/TOC

## 2021-03-21 NOTE — ED Notes (Signed)
[facility] called & ETA is 1400.

## 2021-03-21 NOTE — ED Notes (Addendum)
All required paperwork gathered, EMTALA included & belongings given to [employer] driver, pt is being transported to [facility] at this time.

## 2021-03-21 NOTE — ED Provider Notes (Signed)
Emergency Medicine Observation Re-evaluation Note  Marina Desire is a 80 y.o. male, seen on rounds today.  Pt initially presented to the ED for complaints of Mental Health Problem and Suicidal Currently, the patient is resting.  Physical Exam  BP (!) 146/93 (BP Location: Left Arm)   Pulse 71   Temp 97.9 F (36.6 C) (Oral)   Resp 20   Ht 5\' 11"  (1.803 m)   Wt 90.7 kg   SpO2 97%   BMI 27.89 kg/m  Physical Exam General: resting comfortably, NAD Lungs: normal WOB Psych: currently calm and resting  ED Course / MDM  EKG:EKG Interpretation  Date/Time:  Thursday March 18 2021 03:01:55 EDT Ventricular Rate:  87 PR Interval:  172 QRS Duration: 100 QT Interval:  394 QTC Calculation: 474 R Axis:   66 Text Interpretation: Normal sinus rhythm Normal ECG When compared with ECG of 06/11/2015, No significant change was found Confirmed by 06/13/2015 (Dione Booze) on 03/19/2021 1:08:20 AM   I have reviewed the labs performed to date as well as medications administered while in observation.  Recent changes in the last 24 hours include none.  Plan  Current plan is for [facility] this afternoon.  [facility] has been called.   Onnie Graham, DO 03/21/21 1148

## 2021-03-21 NOTE — ED Notes (Signed)
This RN called to verify report had been called for pt at [facility] Alan Edwards on the unit confirmed that it has & intake told this RN pt can arrive after 1000 this morning. [facility] will be set up accordingly.

## 2024-11-18 ENCOUNTER — Emergency Department (HOSPITAL_COMMUNITY)
Admission: EM | Admit: 2024-11-18 | Discharge: 2024-11-19 | Discharge status: Against Medical Advice | Attending: Emergency Medicine | Admitting: Emergency Medicine

## 2024-11-18 ENCOUNTER — Encounter (HOSPITAL_COMMUNITY): Payer: Self-pay

## 2024-11-18 ENCOUNTER — Emergency Department (HOSPITAL_COMMUNITY)

## 2024-11-18 ENCOUNTER — Other Ambulatory Visit: Payer: Self-pay

## 2024-11-18 DIAGNOSIS — Z5321 Procedure and treatment not carried out due to patient leaving prior to being seen by health care provider: Secondary | ICD-10-CM | POA: Insufficient documentation

## 2024-11-18 DIAGNOSIS — R0789 Other chest pain: Secondary | ICD-10-CM | POA: Insufficient documentation

## 2024-11-18 DIAGNOSIS — R1011 Right upper quadrant pain: Secondary | ICD-10-CM | POA: Insufficient documentation

## 2024-11-18 LAB — COMPREHENSIVE METABOLIC PANEL WITH GFR
ALT: 16 U/L (ref 0–44)
AST: 30 U/L (ref 15–41)
Albumin: 4.8 g/dL (ref 3.5–5.0)
Alkaline Phosphatase: 68 U/L (ref 38–126)
Anion gap: 10 (ref 5–15)
BUN: 14 mg/dL (ref 8–23)
CO2: 30 mmol/L (ref 22–32)
Calcium: 10.2 mg/dL (ref 8.9–10.3)
Chloride: 98 mmol/L (ref 98–111)
Creatinine, Ser: 0.9 mg/dL (ref 0.61–1.24)
GFR, Estimated: >60 mL/min
Glucose, Bld: 103 mg/dL — ABNORMAL HIGH (ref 70–99)
Potassium: 4.6 mmol/L (ref 3.5–5.1)
Sodium: 139 mmol/L (ref 135–145)
Total Bilirubin: 1 mg/dL (ref 0.0–1.2)
Total Protein: 7.7 g/dL (ref 6.5–8.1)

## 2024-11-18 LAB — CBC WITH DIFFERENTIAL/PLATELET
Abs Immature Granulocytes: 0.02 K/uL (ref 0.00–0.07)
Basophils Absolute: 0 K/uL (ref 0.0–0.1)
Basophils Relative: 0 %
Eosinophils Absolute: 0.1 K/uL (ref 0.0–0.5)
Eosinophils Relative: 2 %
HCT: 40.2 % (ref 39.0–52.0)
Hemoglobin: 14.3 g/dL (ref 13.0–17.0)
Immature Granulocytes: 0 %
Lymphocytes Relative: 24 %
Lymphs Abs: 1.1 K/uL (ref 0.7–4.0)
MCH: 36.5 pg — ABNORMAL HIGH (ref 26.0–34.0)
MCHC: 35.6 g/dL (ref 30.0–36.0)
MCV: 102.6 fL — ABNORMAL HIGH (ref 80.0–100.0)
Monocytes Absolute: 0.5 K/uL (ref 0.1–1.0)
Monocytes Relative: 11 %
Neutro Abs: 2.9 K/uL (ref 1.7–7.7)
Neutrophils Relative %: 63 %
Platelets: 147 K/uL — ABNORMAL LOW (ref 150–400)
RBC: 3.92 MIL/uL — ABNORMAL LOW (ref 4.22–5.81)
RDW: 12.1 % (ref 11.5–15.5)
WBC: 4.7 K/uL (ref 4.0–10.5)
nRBC: 0 % (ref 0.0–0.2)

## 2024-11-18 LAB — TROPONIN T, HIGH SENSITIVITY
Troponin T High Sensitivity: <15 ng/L (ref 0–19)
Troponin T High Sensitivity: <15 ng/L (ref 0–19)

## 2024-11-18 LAB — LIPASE, BLOOD: Lipase: 22 U/L (ref 11–51)

## 2024-11-18 NOTE — ED Triage Notes (Signed)
 Complains of RUQ pain that radiates into right chest.  Reports right now it is epigastric and RUQ.  Patient denies sob dizziness, n/v  Was seen at [facility] and couldn't determine what it was so requested to come here.

## 2024-11-18 NOTE — ED Notes (Signed)
 Pt informed this NT he was going home.

## 2024-11-18 NOTE — ED Triage Notes (Signed)
 Patient here with complaints of chest pain for the past 4 days. Patient reports that the chets pain is on the right side of his chest and radiated into the center this morning. Denies pain radiating, and shob. Patient reports that the [place] sent him for further evaluation.

## 2024-11-18 NOTE — ED Provider Triage Note (Signed)
 Emergency Medicine Provider Triage Evaluation Note  Sheri Prows , a 83 y.o. male  was evaluated in triage.  Pt complains of right upper quadrant pain along with chest pain has been ongoing for the last several days.  Evaluated at the [facility] and sent here for further evaluation.  No blood work was done, and EKG this was somewhat abnormal?  Nausea no vomiting, no decrease in oral intake.  Last meal this morning along with a last bowel movement.  No prior history of cardiac disease.  Review of Systems  Positive: Ruq PAIN, cp Negative: N,v,d  Physical Exam  BP (!) 144/73 (BP Location: Right Arm)   Pulse (!) 58   Temp 97.7 F (36.5 C)   Resp 18   Ht 5' 11 (1.803 m)   Wt 90.7 kg   SpO2 100%   BMI 27.89 kg/m  Gen:   Awake, no distress   Resp:  Normal effort  MSK:   Moves extremities without difficulty  Other:  TTP along the RUQ  Medical Decision Making  Medically screening exam initiated at 4:11 PM.  Appropriate orders placed.  Nada Sherwood Rocher was informed that the remainder of the evaluation will be completed by another provider, this initial triage assessment does not replace that evaluation, and the importance of remaining in the ED until their evaluation is complete.     Naylee Frankowski, PA-C 11/18/24 [phone]
